# Patient Record
Sex: Female | Born: 1985
Health system: Southern US, Community
[De-identification: ages and names within clinical notes are randomized; demographics above are authoritative.]

## PROBLEM LIST (undated history)

## (undated) DIAGNOSIS — F32A Depression, unspecified: Secondary | ICD-10-CM

## (undated) DIAGNOSIS — F419 Anxiety disorder, unspecified: Secondary | ICD-10-CM

## (undated) DIAGNOSIS — R519 Headache, unspecified: Secondary | ICD-10-CM

## (undated) HISTORY — PX: ADENOIDECTOMY: SUR15

## (undated) HISTORY — PX: TYMPANOSTOMY TUBE PLACEMENT: SHX32

## (undated) HISTORY — PX: SHOULDER ARTHROSCOPY: SHX128

---

## 2017-11-13 DIAGNOSIS — Z13220 Encounter for screening for lipoid disorders: Secondary | ICD-10-CM | POA: Diagnosis not present

## 2017-11-13 DIAGNOSIS — Z0000 Encounter for general adult medical examination without abnormal findings: Secondary | ICD-10-CM | POA: Diagnosis not present

## 2017-11-13 DIAGNOSIS — Z23 Encounter for immunization: Secondary | ICD-10-CM | POA: Diagnosis not present

## 2018-04-04 DIAGNOSIS — N939 Abnormal uterine and vaginal bleeding, unspecified: Secondary | ICD-10-CM | POA: Diagnosis not present

## 2019-06-04 ENCOUNTER — Ambulatory Visit: Payer: 59 | Admitting: Allergy

## 2019-06-04 ENCOUNTER — Other Ambulatory Visit: Payer: Self-pay

## 2019-06-04 ENCOUNTER — Encounter: Payer: Self-pay | Admitting: Allergy

## 2019-06-04 ENCOUNTER — Telehealth: Payer: Self-pay

## 2019-06-04 VITALS — BP 120/72 | HR 79 | Temp 97.8°F | Resp 18 | Ht 70.0 in | Wt 248.5 lb

## 2019-06-04 DIAGNOSIS — J3089 Other allergic rhinitis: Secondary | ICD-10-CM | POA: Diagnosis not present

## 2019-06-04 DIAGNOSIS — J329 Chronic sinusitis, unspecified: Secondary | ICD-10-CM | POA: Insufficient documentation

## 2019-06-04 MED ORDER — XHANCE 93 MCG/ACT NA EXHU: 2.0000 | INHALANT_SUSPENSION | Freq: Two times a day (BID) | NASAL | 5 refills | Status: DC | PRN

## 2019-06-04 NOTE — Telephone Encounter (Signed)
Dr. Selena Batten would like patient referred to ENT Dr.Newman's office for chronic sinusitis.

## 2019-06-04 NOTE — Assessment & Plan Note (Addendum)
Perennial sinus pressure, headaches, PND for many years but worse the last 1.5 year since moved to [facility]. Patient had sinus surgery in 2007 with some benefit. Tried Singulair, Flonase, OTC antihistamines with no benefit. Sudafed seems to be the only thing that works.   Today's skin testing showed: Positive to weed. Borderline positive to cockroach, ragweed and mold mix #2 - perennial. Results given.   Discussed with patient that I'm not sure how much of the above allergens are causing her current sinus issues.   Start environmental control measures.  Start Xhance 2 sprays twice a day. Samples given. Demonstrated proper use.  Stop using over the counter sudafed.  Nasal saline spray (i.e., Simply Saline) or nasal saline lavage (i.e., NeilMed) is recommended as needed and prior to medicated nasal sprays.  Stop Singulair as not effective.  Refer to ENT for chronic sinusitis and history of sinus surgery - rule out anatomical etiology.

## 2019-06-04 NOTE — Patient Instructions (Addendum)
Today's skin testing showed: Positive to weed. Borderline positive to cockroach, ragweed and mold mix #2 - perennial. Results given.   Not sure how much of the above allergens are causing your current sinus issues.    Start environmental control measures.  Start Xhance 2 sprays twice a day. Samples given. Demonstrated proper use.  Stop using over the counter sudafed.  Nasal saline spray (i.e., Simply Saline) or nasal saline lavage (i.e., NeilMed) is recommended as needed and prior to medicated nasal sprays.  Stop Singulair as not effective.  Refer to ENT for chronic sinusitis.   Follow up in 2 months or sooner if needed.   Reducing Pollen Exposure . Pollen seasons: trees (spring), grass (summer) and ragweed/weeds (fall). Marland Kitchen Keep windows closed in your home and car to lower pollen exposure.  Susa Simmonds air conditioning in the bedroom and throughout the house if possible.  . Avoid going out in dry windy days - especially early morning. . Pollen counts are highest between 5 - 10 AM and on dry, hot and windy days.  . Save outside activities for late afternoon or after a heavy rain, when pollen levels are lower.  . Avoid mowing of grass if you have grass pollen allergy. Marland Kitchen Be aware that pollen can also be transported indoors on people and pets.  . Dry your clothes in an automatic dryer rather than hanging them outside where they might collect pollen.  . Rinse hair and eyes before bedtime.  Cockroach Allergen Avoidance Cockroaches are often found in the homes of densely populated urban areas, schools or commercial buildings, but these creatures can lurk almost anywhere. This does not mean that you have a dirty house or living area. . Block all areas where roaches can enter the home. This includes crevices, wall cracks and windows.  . Cockroaches need water to survive, so fix and seal all leaky faucets and pipes. Have an exterminator go through the house when your family and pets are gone  to eliminate any remaining roaches. Marland Kitchen Keep food in lidded containers and put pet food dishes away after your pets are done eating. Vacuum and sweep the floor after meals, and take out garbage and recyclables. Use lidded garbage containers in the kitchen. Wash dishes immediately after use and clean under stoves, refrigerators or toasters where crumbs can accumulate. Wipe off the stove and other kitchen surfaces and cupboards regularly.  Mold Control . Mold and fungi can grow on a variety of surfaces provided certain temperature and moisture conditions exist.  . Outdoor molds grow on plants, decaying vegetation and soil. The major outdoor mold, Alternaria and Cladosporium, are found in very high numbers during hot and dry conditions. Generally, a late summer - fall peak is seen for common outdoor fungal spores. Rain will temporarily lower outdoor mold spore count, but counts rise rapidly when the rainy period ends. . The most important indoor molds are Aspergillus and Penicillium. Dark, humid and poorly ventilated basements are ideal sites for mold growth. The next most common sites of mold growth are the bathroom and the kitchen. Outdoor (Seasonal) Mold Control . Use air conditioning and keep windows closed. . Avoid exposure to decaying vegetation. Marland Kitchen Avoid leaf raking. . Avoid grain handling. . Consider wearing a face mask if working in moldy areas.  Indoor (Perennial) Mold Control  . Maintain humidity below 50%. . Get rid of mold growth on hard surfaces with water, detergent and, if necessary, 5% bleach (do not mix with other cleaners). Then  dry the area completely. If mold covers an area more than 10 square feet, consider hiring an indoor environmental professional. . For clothing, washing with soap and water is best. If moldy items cannot be cleaned and dried, throw them away. . Remove sources e.g. contaminated carpets. . Repair and seal leaking roofs or pipes. Using dehumidifiers in damp  basements may be helpful, but empty the water and clean units regularly to prevent mildew from forming. All rooms, especially basements, bathrooms and kitchens, require ventilation and cleaning to deter mold and mildew growth. Avoid carpeting on concrete or damp floors, and storing items in damp areas.  Buffered Isotonic Saline Irrigations:  Goal: . When you irrigate with the isotonic saline (salt water) it washes mucous and other debris from your nose that could be contributing to your nasal symptoms.   Recipe: Marland Kitchen Obtain 1 quart jar that is clean . Fill with clean (bottled, boiled or distilled) water . Add 1-2 heaping teaspoons of salt without iodine o If the solution with 2 teaspoons of salt is too strong, adjust the amount down until better tolerated . Add 1 teaspoon of [employer] baking soda (pure bicarbonate) . Mix ingredients together and store at room temperature and discard after 1 week * Alternatively you can buy pre made salt packets for the NeilMed bottle or there          are other over the counter brands available  Instructions: . Warm  cup of the solution in the microwave if desired but be careful not to overheat as this will burn the inside of your nose . Stand over a sink (or do it while you shower) and squirt the solution into one side of your nose aiming towards the back of your head o Sometimes saying "coca cola" while irrigating can be helpful to prevent fluid from going down your throat  . The solution will travel to the back of your nose and then come out the other side . Perform this again on the other side . Try to do this twice a day . If you are using a nasal spray in addition to the irrigation, irrigate first and then use the topical nasal spray otherwise you will wash the nasal spray out of your nose

## 2019-06-04 NOTE — Progress Notes (Signed)
New Patient Note  RE: Bonnie Baker MRN: [id number] DOB: Sep 19, 1985 Date of Office Visit: 06/04/2019  Referring provider: Soundra Pilon, FNP Primary care provider: Soundra Pilon, FNP  Chief Complaint: Allergic Rhinitis   History of Present Illness: I had the pleasure of seeing Bonnie Baker for initial evaluation at the [facility] on 06/04/2019. She is a 34 y.o. female, who is referred here by Soundra Pilon, FNP for the evaluation of environmental allergies.  Allergic rhinitis: She reports symptoms of sinus pressure, PND, headaches/migraines. Symptoms have been going on for 1.5 years. The symptoms are present all year around with worsening in winter. Other triggers include exposure to none. Anosmia: no. Headache: yes. She has used Flonase, Singulair, OTC antihistamines, sudafed with minimal improvement in symptoms. Sinus infections: 0. Previous work up includes: skin testing in 2006 was positive to trees, weeds and grasses per patient report. Previous ENT evaluation: yes in [place] and had sinus surgery in 2007 with some benefit. Previous sinus imaging: not recently. History of nasal polyps: no. Last eye exam: 6 months ago. History of reflux: denies.  Assessment and Plan: Bonnie Baker is a 34 y.o. female with: Chronic sinusitis Perennial sinus pressure, headaches, PND for many years but worse the last 1.5 year since moved to [facility]. Patient had sinus surgery in 2007 with some benefit. Tried Singulair, Flonase, OTC antihistamines with no benefit. Sudafed seems to be the only thing that works.   Today's skin testing showed: Positive to weed. Borderline positive to cockroach, ragweed and mold mix #2 - perennial. Results given.   Discussed with patient that I'm not sure how much of the above allergens are causing her current sinus issues.   Start environmental control measures.  Start Xhance 2 sprays twice a day. Samples given. Demonstrated proper use.  Stop using  over the counter sudafed.  Nasal saline spray (i.e., Simply Saline) or nasal saline lavage (i.e., NeilMed) is recommended as needed and prior to medicated nasal sprays.  Stop Singulair as not effective.  Refer to ENT for chronic sinusitis and history of sinus surgery - rule out anatomical etiology.   Return in about 2 months (around 08/04/2019).  Meds ordered this encounter  Medications  . Fluticasone Propionate (XHANCE) 93 MCG/ACT EXHU    Sig: Place 2 sprays into the nose 2 (two) times daily as needed.    Dispense:  32 mL    Refill:  5    [id number] (M)   Other allergy screening: Asthma: no Rhino conjunctivitis: yes Food allergy: no Medication allergy: no Hymenoptera allergy: no Urticaria: no Eczema:no History of recurrent infections suggestive of immunodeficency: no  Diagnostics: Skin Testing: Environmental allergy panel. Positive to weed. Borderline positive to cockroach, ragweed and mold mix #2 - perennial. Results discussed with patient/family. Airborne Adult Perc - 06/04/19 0943    Time Antigen Placed  2094    Allergen Manufacturer  Waynette Buttery    Location  Back    Number of Test  59    Panel 1  Select    1. Control-Buffer 50% Glycerol  Negative    2. Control-Histamine 1 mg/ml  2+    3. Albumin saline  Negative    4. Bahia  Negative    5. French Southern Territ[place]  Negative    6. Johnson  Negative    7. Kentucky Blue  Negative    8. Meadow Fescue  Negative    9. Perennial Rye  Negative    10. Sweet Vernal  Negative  11. Timothy  Negative    12. Cocklebur  Negative    13. Burweed Marshelder  Negative    14. Ragweed, short  Negative    15. Ragweed, Giant  Negative    16. Plantain,  English  Negative    17. Lamb's Quarters  Negative    18. Sheep Sorrell  Negative    19. Rough Pigweed  Negative    20. Marsh Elder, Rough  Negative    21. Mugwort, Common  Negative    22. Ash mix  Negative    23. Birch mix  Negative    24. Beech American  Negative    25. Box, Elder  Negative     26. Cedar, red  Negative    27. Cottonwood, Guinea-Bissau  Negative    28. Elm mix  Negative    29. Hickory mix  Negative    30. Maple mix  Negative    31. Oak, Guinea-Bissau mix  Negative    32. Pecan Pollen  Negative    33. Pine mix  Negative    34. Sycamore Eastern  Negative    35. Walnut, Black Pollen  Negative    36. Alternaria alternata  Negative    37. Cladosporium Herbarum  Negative    38. Aspergillus mix  Negative    39. Penicillium mix  Negative    40. Bipolaris sorokiniana (Helminthosporium)  Negative    41. Drechslera spicifera (Curvularia)  Negative    42. Mucor plumbeus  Negative    43. Fusarium moniliforme  Negative    44. Aureobasidium pullulans (pullulara)  Negative    45. Rhizopus oryzae  Negative    46. Botrytis cinera  Negative    47. Epicoccum nigrum  Negative    48. Phoma betae  Negative    49. Candida Albicans  Negative    50. Trichophyton mentagrophytes  Negative    51. Mite, D Farinae  5,000 AU/ml  Negative    52. Mite, D Pteronyssinus  5,000 AU/ml  Negative    53. Cat Hair 10,000 BAU/ml  Negative    54.  Dog Epithelia  Negative    55. Mixed Feathers  Negative    56. Horse Epithelia  Negative    57. Cockroach, [place]  --   +/-   58. Mouse  Negative    59. Tobacco Leaf  Negative     Intradermal - 06/04/19 1032    Time Antigen Placed  1032    Allergen Manufacturer  Greer    Location  Back    Number of Test  14    Intradermal  Select    Control  Negative    French Southern Territ[place]  Negative    Johnson  Negative    7 Grass  Negative    Ragweed mix  --   +/-   Weed mix  2+    Tree mix  Negative    Mold 1  Negative    Mold 2  --   +/-   Mold 3  Negative    Mold 4  Negative    Cat  Negative    Dog  Negative    Mite mix  Negative       Past Medical History: Patient Active Problem List   Diagnosis Date Noted  . Chronic sinusitis 06/04/2019  . Other allergic rhinitis 06/04/2019   History reviewed. No pertinent past medical history. Past Surgical  History: Past Surgical History:  Procedure Laterality Date  . ADENOIDECTOMY    .  TYMPANOSTOMY TUBE PLACEMENT     Medication List:  Current Outpatient Medications  Medication Sig Dispense Refill  . escitalopram (LEXAPRO) 20 MG tablet Take 20 mg by mouth daily.    Marland Kitchen ibuprofen (ADVIL) 200 MG tablet Take 600-800 mg by mouth every 6 (six) hours as needed.    . Prenatal 27-1 MG TABS Take 1 tablet by mouth daily.    . pseudoephedrine (SUDAFED) 60 MG tablet Take 60 mg by mouth every 4 (four) hours as needed for congestion.    . Fluticasone Propionate (XHANCE) 93 MCG/ACT EXHU Place 2 sprays into the nose 2 (two) times daily as needed. 32 mL 5   No current facility-administered medications for this visit.   Allergies: No Known Allergies Social History: Social History   Socioeconomic History  . Marital status: Married    Spouse name: Not on file  . Number of children: Not on file  . Years of education: Not on file  . Highest education level: Not on file  Occupational History  . Not on file  Tobacco Use  . Smoking status: Never Smoker  . Smokeless tobacco: Never Used  Substance and Sexual Activity  . Alcohol use: Yes  . Drug use: Never  . Sexual activity: Not on file  Other Topics Concern  . Not on file  Social History Narrative  . Not on file   Social Determinants of Health   Financial Resource Strain:   . Difficulty of Paying Living Expenses:   Food Insecurity:   . Worried About Programme researcher, broadcasting/film/video in the Last Year:   . Barista in the Last Year:   Transportation Needs:   . Freight forwarder (Medical):   Marland Kitchen Lack of Transportation (Non-Medical):   Physical Activity:   . Days of Exercise per Week:   . Minutes of Exercise per Session:   Stress:   . Feeling of Stress :   Social Connections:   . Frequency of Communication with Friends and Family:   . Frequency of Social Gatherings with Friends and Family:   . Attends Religious Services:   . Active Member of  Clubs or Organizations:   . Attends Banker Meetings:   Marland Kitchen Marital Status:    Lives in a 34 year old home. Smoking: denies Occupation: Adult nurse HistorySurveyor, minerals in the house: no Engineer, civil (consulting) in the family room: yes Carpet in the bedroom: yes Heating: electric Cooling: central Pet: yes a dog x 11 years  Family History: Family History  Problem Relation Age of Onset  . Allergic rhinitis Mother   . Allergic rhinitis Brother   . Allergic rhinitis Brother    Review of Systems  Constitutional: Negative for appetite change, chills, fever and unexpected weight change.  HENT: Positive for congestion, postnasal drip, sinus pressure and voice change. Negative for rhinorrhea.   Eyes: Negative for itching.  Respiratory: Negative for cough, chest tightness, shortness of breath and wheezing.   Cardiovascular: Negative for chest pain.  Gastrointestinal: Negative for abdominal pain.  Genitourinary: Negative for difficulty urinating.  Skin: Negative for rash.  Allergic/Immunologic: Positive for environmental allergies. Negative for food allergies.  Neurological: Positive for headaches.   Objective: BP 120/72 (BP Location: Left Arm, Patient Position: Sitting, Cuff Size: Normal)   Pulse 79   Temp 97.8 F (36.6 C) (Temporal)   Resp 18   Ht 5\' 10"  (1.778 m)   Wt 248 lb 8 oz (112.7 kg)   SpO2 96%  BMI 35.66 kg/m  Body mass index is 35.66 kg/m. Physical Exam  Constitutional: She is oriented to person, place, and time. She appears well-developed and well-nourished.  HENT:  Head: Normocephalic and atraumatic.  Right Ear: External ear normal.  Left Ear: External ear normal.  Mouth/Throat: Oropharynx is clear and moist.  Right side nasal congestion  Eyes: Conjunctivae and EOM are normal.  Cardiovascular: Normal rate, regular rhythm and normal heart sounds. Exam reveals no gallop and no friction rub.  No murmur heard. Pulmonary/Chest: Effort normal and  breath sounds normal. She has no wheezes. She has no rales.  Abdominal: Soft.  Musculoskeletal:     Cervical back: Neck supple.  Neurological: She is alert and oriented to person, place, and time.  Skin: Skin is warm. No rash noted.  Psychiatric: She has a normal mood and affect. Her behavior is normal.  Nursing note and vitals reviewed.  The plan was reviewed with the patient/family, and all questions/concerned were addressed.  It was my pleasure to see Bonnie Baker today and participate in her care. Please feel free to contact me with any questions or concerns.  Sincerely,  Rexene Alberts, DO Allergy & Immunology  [facility] office: [phone] [facility] office: [place] office: [phone]

## 2019-06-05 NOTE — Telephone Encounter (Signed)
Patients referral has been placed to Dr Ezzard Standing for scheduling.   Thanks

## 2019-06-23 ENCOUNTER — Encounter (INDEPENDENT_AMBULATORY_CARE_PROVIDER_SITE_OTHER): Payer: Self-pay | Admitting: Otolaryngology

## 2019-06-23 ENCOUNTER — Other Ambulatory Visit: Payer: Self-pay

## 2019-06-23 ENCOUNTER — Ambulatory Visit (INDEPENDENT_AMBULATORY_CARE_PROVIDER_SITE_OTHER): Payer: 59 | Admitting: Otolaryngology

## 2019-06-23 VITALS — Temp 97.9°F

## 2019-06-23 DIAGNOSIS — R519 Headache, unspecified: Secondary | ICD-10-CM | POA: Diagnosis not present

## 2019-06-23 DIAGNOSIS — J310 Chronic rhinitis: Secondary | ICD-10-CM | POA: Diagnosis not present

## 2019-06-23 DIAGNOSIS — Z8669 Personal history of other diseases of the nervous system and sense organs: Secondary | ICD-10-CM | POA: Diagnosis not present

## 2019-06-23 NOTE — Progress Notes (Signed)
HPI: Bonnie Baker is a 34 y.o. female who presents is referred by Dr. Maudie Mercury for evaluation of sinuses.  Her main complaint seems to be headaches that she relates to her sinuses.  She has seen an allergist with negative allergy work-up.  She is presently on XHANCE nasal spray which he uses regularly.  She denies any trouble breathing through her nose.  She feels pressure in her cheek area on both sides.  She states that she has 2 or 3 migraines a week. She states that she had sinus surgery performed when she was 18. She just recently moved to [place] area a couple years ago and that her symptoms have been worse since moving here.  She feels like some of it is related to allergies.. She denies any yellow-green discharge from her nose but does feel pressure in the cheek areas today.  No past medical history on file. Past Surgical History:  Procedure Laterality Date  . ADENOIDECTOMY    . TYMPANOSTOMY TUBE PLACEMENT     Social History   Socioeconomic History  . Marital status: Married    Spouse name: Not on file  . Number of children: Not on file  . Years of education: Not on file  . Highest education level: Not on file  Occupational History  . Not on file  Tobacco Use  . Smoking status: Never Smoker  . Smokeless tobacco: Never Used  Substance and Sexual Activity  . Alcohol use: Yes  . Drug use: Never  . Sexual activity: Not on file  Other Topics Concern  . Not on file  Social History Narrative  . Not on file   Social Determinants of Health   Financial Resource Strain:   . Difficulty of Paying Living Expenses:   Food Insecurity:   . Worried About Charity fundraiser in the Last Year:   . Arboriculturist in the Last Year:   Transportation Needs:   . Film/video editor (Medical):   Marland Kitchen Lack of Transportation (Non-Medical):   Physical Activity:   . Days of Exercise per Week:   . Minutes of Exercise per Session:   Stress:   . Feeling of Stress :   Social Connections:    . Frequency of Communication with Friends and Family:   . Frequency of Social Gatherings with Friends and Family:   . Attends Religious Services:   . Active Member of Clubs or Organizations:   . Attends Archivist Meetings:   Marland Kitchen Marital Status:    Family History  Problem Relation Age of Onset  . Allergic rhinitis Mother   . Allergic rhinitis Brother   . Allergic rhinitis Brother    No Known Allergies Prior to Admission medications   Medication Sig Start Date End Date Taking? Authorizing Provider  escitalopram (LEXAPRO) 20 MG tablet Take 20 mg by mouth daily. 05/24/19  Yes [provider]  Fluticasone Propionate (XHANCE) 93 MCG/ACT EXHU Place 2 sprays into the nose 2 (two) times daily as needed. 06/04/19  Yes Garnet Sierras, DO  ibuprofen (ADVIL) 200 MG tablet Take 600-800 mg by mouth every 6 (six) hours as needed.   Yes [provider]  Prenatal 27-1 MG TABS Take 1 tablet by mouth daily. 05/24/19  Yes [provider]  pseudoephedrine (SUDAFED) 60 MG tablet Take 60 mg by mouth every 4 (four) hours as needed for congestion.   Yes [provider]     Positive ROS: Otherwise negative  All other  systems have been reviewed and were otherwise negative with the exception of those mentioned in the HPI and as above.  Physical Exam: Constitutional: Alert, well-appearing, no acute distress Ears: External ears without lesions or tenderness. Ear canals are clear bilaterally with intact, clear TMs bilaterally. Nasal: External nose without lesions. Septum mildly deviated to the left.. Clear nasal passages bilaterally. Nasal endoscopy was performed in the office today.  On nasal endoscopy both middle meatus regions were widely patent.  The maxillary ostia could be identified easily on both sides and was widely patent.  The anterior ethmoid areas were clear bilaterally with no obvious mucopurulent discharge noted.  No obvious purulent discharge noted from the  posterior ethmoid or sphenoid region. Oral: Lips and gums without lesions. Tongue and palate mucosa without lesions. Posterior oropharynx clear. Neck: No palpable adenopathy or masses Respiratory: Breathing comfortably  Skin: No facial/neck lesions or rash noted.  Nasal/sinus endoscopy  Date/Time: 06/23/2019 5:38 PM Performed by: Drema Halon, MD Authorized by: Drema Halon, MD   Consent:    Consent obtained:  Verbal   Consent given by:  Patient Procedure details:    Indications: sino-nasal symptoms     Medication:  Afrin   Instrument: flexible fiberoptic nasal endoscope     Scope location: bilateral nare   Septum:    Deviation: deviated to the left     Severity of deviation: mild   Sinus:    Right middle meatus: normal     Left middle meatus: normal     Right nasopharynx: normal     Left nasopharynx: normal   Comments:     On nasal endoscopy the middle meatus regions were clear bilaterally in both maxillary ostia were patent bilaterally with no drainage noted.    Assessment: History of headaches. History of previous sinus surgery with clear sinuses on nasal endoscopy in the office today.  Plan: Discussed with the patient today concerning obtaining a CT scan for complete evaluation of the sinuses to rule out any residual disease.  But discussed with her concerning normal examination and no need for antibiotics at this time.  Would recommend continue with nasal steroid spray and saline rinses as needed. She will call us following the results of the CT scan.   Narda Bonds, MD   CC:

## 2019-06-26 ENCOUNTER — Other Ambulatory Visit (INDEPENDENT_AMBULATORY_CARE_PROVIDER_SITE_OTHER): Payer: Self-pay

## 2019-06-26 DIAGNOSIS — J329 Chronic sinusitis, unspecified: Secondary | ICD-10-CM

## 2019-06-26 NOTE — Progress Notes (Signed)
Ct max wo

## 2019-06-30 ENCOUNTER — Telehealth: Payer: Self-pay

## 2019-06-30 NOTE — Telephone Encounter (Signed)
Prior authorization was denied because Bonnie Baker is excluded from coverage. Patient can still do the 50.00 co-pay if they would like to do so. I have left a message with the patient to call back to discuss this.

## 2019-06-30 NOTE — Telephone Encounter (Signed)
Prior authorization for Xhance submitted on covermymeds.  

## 2019-07-03 NOTE — Telephone Encounter (Signed)
I left a detailed message explaining co-pay details and options to patient. I let her know that if she does not wish to pay that co-pay to give [place] a call back.

## 2019-07-09 ENCOUNTER — Other Ambulatory Visit: Payer: Self-pay

## 2019-07-09 ENCOUNTER — Ambulatory Visit
Admission: RE | Admit: 2019-07-09 | Discharge: 2019-07-09 | Disposition: A | Discharge status: Home / Self Care | Payer: 59 | Source: Ambulatory Visit | Attending: Otolaryngology | Admitting: Otolaryngology

## 2019-08-12 ENCOUNTER — Ambulatory Visit: Payer: 59 | Admitting: Allergy

## 2019-08-12 NOTE — Progress Notes (Deleted)
Follow Up Note  RE: Bonnie Baker MRN: [id number] DOB: Aug 17, 1985 Date of Office Visit: 08/12/2019  Referring provider: Soundra Pilon, FNP Primary care provider: Soundra Pilon, FNP  Chief Complaint: No chief complaint on file.  History of Present Illness: I had the pleasure of seeing [name] for a follow up visit at the [facility] on 08/12/2019. She is a 34 y.o. female, who is being followed for chronic sinusitis. Her previous allergy office visit was on 06/04/2019 with Dr. Selena Batten. Today is a regular follow up visit.  Chronic sinusitis Perennial sinus pressure, headaches, PND for many years but worse the last 1.5 year since moved to [facility]. Patient had sinus surgery in 2007 with some benefit. Tried Singulair, Flonase, OTC antihistamines with no benefit. Sudafed seems to be the only thing that works.   Today's skin testing showed: Positive to weed. Borderline positive to cockroach, ragweed and mold mix #2 - perennial. Results given.   Discussed with patient that I'm not sure how much of the above allergens are causing her current sinus issues.   Start environmental control measures.  Start Xhance 2 sprays twice a day. Samples given. Demonstrated proper use.  Stop using over the counter sudafed.  Nasal saline spray (i.e., Simply Saline) or nasal saline lavage (i.e., NeilMed) is recommended as needed and prior to medicated nasal sprays.  Stop Singulair as not effective.  Refer to ENT for chronic sinusitis and history of sinus surgery - rule out anatomical etiology.   Return in about 2 months (around 08/04/2019).   Assessment and Plan: [name] is a 34 y.o. female with: No problem-specific Assessment & Plan notes found for this encounter.  No follow-ups on file.  No orders of the defined types were placed in this encounter.  Lab Orders  No laboratory test(s) ordered today    Diagnostics: Spirometry:  Tracings reviewed. Her effort: {Blank  single:19197::"Good reproducible efforts.","It was hard to get consistent efforts and there is a question as to whether this reflects a maximal maneuver.","Poor effort, data can not be interpreted."} FVC: ***L FEV1: ***L, ***% predicted FEV1/FVC ratio: ***% Interpretation: {Blank single:19197::"Spirometry consistent with mild obstructive disease","Spirometry consistent with moderate obstructive disease","Spirometry consistent with severe obstructive disease","Spirometry consistent with possible restrictive disease","Spirometry consistent with mixed obstructive and restrictive disease","Spirometry uninterpretable due to technique","Spirometry consistent with normal pattern","No overt abnormalities noted given today's efforts"}.  Please see scanned spirometry results for details.  Skin Testing: {Blank single:19197::"Select foods","Environmental allergy panel","Environmental allergy panel and select foods","Food allergy panel","None","Deferred due to recent antihistamines use"}. Positive test to: ***. Negative test to: ***.  Results discussed with patient/family.   Medication List:  Current Outpatient Medications  Medication Sig Dispense Refill  . escitalopram (LEXAPRO) 20 MG tablet Take 20 mg by mouth daily.    . Fluticasone Propionate (XHANCE) 93 MCG/ACT EXHU Place 2 sprays into the nose 2 (two) times daily as needed. 32 mL 5  . ibuprofen (ADVIL) 200 MG tablet Take 600-800 mg by mouth every 6 (six) hours as needed.    . Prenatal 27-1 MG TABS Take 1 tablet by mouth daily.    . pseudoephedrine (SUDAFED) 60 MG tablet Take 60 mg by mouth every 4 (four) hours as needed for congestion.     No current facility-administered medications for this visit.   Allergies: No Known Allergies I reviewed her past medical history, social history, family history, and environmental history and no significant changes have been reported from her previous visit.  Review of  Systems  Constitutional: Negative for  appetite change, chills, fever and unexpected weight change.  HENT: Positive for congestion, postnasal drip, sinus pressure and voice change. Negative for rhinorrhea.   Eyes: Negative for itching.  Respiratory: Negative for cough, chest tightness, shortness of breath and wheezing.   Cardiovascular: Negative for chest pain.  Gastrointestinal: Negative for abdominal pain.  Genitourinary: Negative for difficulty urinating.  Skin: Negative for rash.  Allergic/Immunologic: Positive for environmental allergies. Negative for food allergies.  Neurological: Positive for headaches.   Objective: There were no vitals taken for this visit. There is no height or weight on file to calculate BMI. Physical Exam Vitals and nursing note reviewed.  Constitutional:      Appearance: She is well-developed.  HENT:     Head: Normocephalic and atraumatic.     Right Ear: External ear normal.     Left Ear: External ear normal.  Eyes:     Conjunctiva/sclera: Conjunctivae normal.  Cardiovascular:     Rate and Rhythm: Normal rate and regular rhythm.     Heart sounds: Normal heart sounds. No murmur heard.  No friction rub. No gallop.   Pulmonary:     Effort: Pulmonary effort is normal.     Breath sounds: Normal breath sounds. No wheezing or rales.  Abdominal:     Palpations: Abdomen is soft.  Musculoskeletal:     Cervical back: Neck supple.  Skin:    General: Skin is warm.     Findings: No rash.  Neurological:     Mental Status: She is alert and oriented to person, place, and time.  Psychiatric:        Behavior: Behavior normal.    Previous notes and tests were reviewed. The plan was reviewed with the patient/family, and all questions/concerned were addressed.  It was my pleasure to see Bonnie Baker today and participate in her care. Please feel free to contact me with any questions or concerns.  Sincerely,  Wyline Mood, DO Allergy & Immunology  [facility]  office: [phone] [facility] office: [phone] [place] office: [phone]

## 2019-09-08 ENCOUNTER — Emergency Department (HOSPITAL_COMMUNITY)
Admission: EM | Admit: 2019-09-08 | Discharge: 2019-09-08 | Disposition: A | Discharge status: Home / Self Care | Payer: 59 | Attending: Emergency Medicine | Admitting: Emergency Medicine

## 2019-09-08 ENCOUNTER — Other Ambulatory Visit: Payer: Self-pay

## 2019-09-08 ENCOUNTER — Encounter (HOSPITAL_COMMUNITY): Payer: Self-pay | Admitting: Emergency Medicine

## 2019-09-08 ENCOUNTER — Emergency Department (HOSPITAL_COMMUNITY): Payer: 59

## 2019-09-08 DIAGNOSIS — S161XXA Strain of muscle, fascia and tendon at neck level, initial encounter: Secondary | ICD-10-CM | POA: Insufficient documentation

## 2019-09-08 DIAGNOSIS — Y9289 Other specified places as the place of occurrence of the external cause: Secondary | ICD-10-CM | POA: Insufficient documentation

## 2019-09-08 DIAGNOSIS — Y9389 Activity, other specified: Secondary | ICD-10-CM | POA: Diagnosis not present

## 2019-09-08 DIAGNOSIS — M25511 Pain in right shoulder: Secondary | ICD-10-CM | POA: Diagnosis not present

## 2019-09-08 DIAGNOSIS — Y999 Unspecified external cause status: Secondary | ICD-10-CM | POA: Diagnosis not present

## 2019-09-08 DIAGNOSIS — S199XXA Unspecified injury of neck, initial encounter: Secondary | ICD-10-CM | POA: Diagnosis present

## 2019-09-08 MED ORDER — CELECOXIB 200 MG PO CAPS
200.0000 mg | ORAL_CAPSULE | Freq: Two times a day (BID) | ORAL | 0 refills | Status: DC
Start: 2019-09-08 — End: 2021-04-13

## 2019-09-08 MED ORDER — CYCLOBENZAPRINE HCL 10 MG PO TABS: 5.0000 mg | ORAL_TABLET | Freq: Two times a day (BID) | ORAL | 0 refills | Status: DC | PRN

## 2019-09-08 MED ORDER — IBUPROFEN 800 MG PO TABS
800.0000 mg | ORAL_TABLET | Freq: Once | ORAL | Status: AC
  Administered 2019-09-08: 800 mg via ORAL
  Filled 2019-09-08: qty 1
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
## 2019-09-08 NOTE — Discharge Instructions (Addendum)
Your images were negative for acute abnormality.  Apply ice to sore areas for 30 minutes at a time, 4 times a day.  Take ibuprofen, naproxen, or acetaminophen as needed for pain.  Return to the emergency department immediately if you develop any of the following symptoms: You have numbness, tingling, or weakness in the arms or legs. You develop severe headaches not relieved with medicine. You have severe neck pain, especially tenderness in the middle of the back of your neck. You have changes in bowel or bladder control. There is increasing pain in any area of the body. You have shortness of breath, light-headedness, dizziness, or fainting. You have chest pain. You feel sick to your stomach (nauseous), throw up (vomit), or sweat. You have increasing abdominal discomfort. There is blood in your urine, stool, or vomit. You have pain in your shoulder (shoulder strap areas). You feel your symptoms are getting worse.

## 2020-11-02 LAB — OB RESULTS CONSOLE HIV ANTIBODY (ROUTINE TESTING): HIV: NONREACTIVE

## 2020-11-02 LAB — OB RESULTS CONSOLE HEPATITIS B SURFACE ANTIGEN: Hepatitis B Surface Ag: NEGATIVE

## 2020-11-02 LAB — OB RESULTS CONSOLE RUBELLA ANTIBODY, IGM: Rubella: NON-IMMUNE/NOT IMMUNE

## 2020-11-02 LAB — OB RESULTS CONSOLE RPR: RPR: NONREACTIVE

## 2021-04-10 ENCOUNTER — Encounter (HOSPITAL_COMMUNITY)
Admission: AD | Disposition: A | Discharge status: Home / Self Care | Payer: Self-pay | Source: Home / Self Care | Attending: Obstetrics and Gynecology

## 2021-04-10 ENCOUNTER — Inpatient Hospital Stay (HOSPITAL_BASED_OUTPATIENT_CLINIC_OR_DEPARTMENT_OTHER): Payer: 59

## 2021-04-10 ENCOUNTER — Other Ambulatory Visit: Payer: Self-pay

## 2021-04-10 ENCOUNTER — Inpatient Hospital Stay (HOSPITAL_COMMUNITY): Payer: 59 | Admitting: Certified Registered Nurse Anesthetist

## 2021-04-10 ENCOUNTER — Inpatient Hospital Stay (HOSPITAL_COMMUNITY)
Admission: AD | Admit: 2021-04-10 | Discharge: 2021-04-13 | DRG: 786 | Disposition: A | Discharge status: Home / Self Care | Payer: 59 | Attending: Obstetrics and Gynecology | Admitting: Obstetrics and Gynecology

## 2021-04-10 ENCOUNTER — Other Ambulatory Visit: Payer: Self-pay | Admitting: Obstetrics and Gynecology

## 2021-04-10 ENCOUNTER — Encounter (HOSPITAL_COMMUNITY): Payer: Self-pay | Admitting: Obstetrics and Gynecology

## 2021-04-10 DIAGNOSIS — Z20822 Contact with and (suspected) exposure to covid-19: Secondary | ICD-10-CM | POA: Diagnosis present

## 2021-04-10 DIAGNOSIS — F419 Anxiety disorder, unspecified: Secondary | ICD-10-CM | POA: Diagnosis present

## 2021-04-10 DIAGNOSIS — O368130 Decreased fetal movements, third trimester, not applicable or unspecified: Secondary | ICD-10-CM

## 2021-04-10 DIAGNOSIS — O99344 Other mental disorders complicating childbirth: Secondary | ICD-10-CM | POA: Diagnosis present

## 2021-04-10 DIAGNOSIS — F32A Depression, unspecified: Secondary | ICD-10-CM | POA: Diagnosis present

## 2021-04-10 DIAGNOSIS — Z8659 Personal history of other mental and behavioral disorders: Secondary | ICD-10-CM

## 2021-04-10 DIAGNOSIS — Z3A33 33 weeks gestation of pregnancy: Secondary | ICD-10-CM

## 2021-04-10 DIAGNOSIS — Z98891 History of uterine scar from previous surgery: Secondary | ICD-10-CM

## 2021-04-10 DIAGNOSIS — O34219 Maternal care for unspecified type scar from previous cesarean delivery: Secondary | ICD-10-CM | POA: Diagnosis not present

## 2021-04-10 DIAGNOSIS — O4593 Premature separation of placenta, unspecified, third trimester: Secondary | ICD-10-CM

## 2021-04-10 DIAGNOSIS — O43123 Velamentous insertion of umbilical cord, third trimester: Secondary | ICD-10-CM | POA: Diagnosis present

## 2021-04-10 DIAGNOSIS — O4700 False labor before 37 completed weeks of gestation, unspecified trimester: Secondary | ICD-10-CM | POA: Diagnosis not present

## 2021-04-10 DIAGNOSIS — O43193 Other malformation of placenta, third trimester: Secondary | ICD-10-CM

## 2021-04-10 DIAGNOSIS — O3660X0 Maternal care for excessive fetal growth, unspecified trimester, not applicable or unspecified: Secondary | ICD-10-CM

## 2021-04-10 DIAGNOSIS — O368190 Decreased fetal movements, unspecified trimester, not applicable or unspecified: Secondary | ICD-10-CM

## 2021-04-10 DIAGNOSIS — O4590 Premature separation of placenta, unspecified, unspecified trimester: Secondary | ICD-10-CM

## 2021-04-10 DIAGNOSIS — O26899 Other specified pregnancy related conditions, unspecified trimester: Secondary | ICD-10-CM

## 2021-04-10 DIAGNOSIS — O34211 Maternal care for low transverse scar from previous cesarean delivery: Secondary | ICD-10-CM | POA: Diagnosis present

## 2021-04-10 LAB — GC/CHLAMYDIA PROBE AMP (~~LOC~~) NOT AT ARMC
Chlamydia: NEGATIVE
Comment: NEGATIVE
Comment: NORMAL
Neisseria Gonorrhea: NEGATIVE

## 2021-04-10 LAB — COMPREHENSIVE METABOLIC PANEL
ALT: 11 U/L (ref 0–44)
AST: 16 U/L (ref 15–41)
Albumin: 2.7 g/dL — ABNORMAL LOW (ref 3.5–5.0)
Alkaline Phosphatase: 81 U/L (ref 38–126)
Anion gap: 9 (ref 5–15)
BUN: 6 mg/dL (ref 6–20)
CO2: 21 mmol/L — ABNORMAL LOW (ref 22–32)
Calcium: 8.9 mg/dL (ref 8.9–10.3)
Chloride: 105 mmol/L (ref 98–111)
Creatinine, Ser: 0.52 mg/dL (ref 0.44–1.00)
GFR, Estimated: >60 mL/min (ref >60)
Glucose, Bld: 106 mg/dL — ABNORMAL HIGH (ref 70–99)
Potassium: 3.6 mmol/L (ref 3.5–5.1)
Sodium: 135 mmol/L (ref 135–145)
Total Bilirubin: 0.6 mg/dL (ref 0.3–1.2)
Total Protein: 6.2 g/dL — ABNORMAL LOW (ref 6.5–8.1)

## 2021-04-10 LAB — URINALYSIS, ROUTINE W REFLEX MICROSCOPIC
Bilirubin Urine: NEGATIVE
Glucose, UA: NEGATIVE mg/dL
Hgb urine dipstick: NEGATIVE
Ketones, ur: NEGATIVE mg/dL
Leukocytes,Ua: NEGATIVE
Nitrite: NEGATIVE
Protein, ur: NEGATIVE mg/dL
Specific Gravity, Urine: 1.009 (ref 1.005–1.030)
pH: 6 (ref 5.0–8.0)

## 2021-04-10 LAB — PROTEIN / CREATININE RATIO, URINE
Creatinine, Urine: 58.43 mg/dL
Total Protein, Urine: <6 mg/dL

## 2021-04-10 LAB — WET PREP, GENITAL
Clue Cells Wet Prep HPF POC: NONE SEEN
Sperm: NONE SEEN
Trich, Wet Prep: NONE SEEN
WBC, Wet Prep HPF POC: >=10 — AB
Yeast Wet Prep HPF POC: NONE SEEN

## 2021-04-10 LAB — RESP PANEL BY RT-PCR (FLU A&B, COVID) ARPGX2
Influenza A by PCR: NEGATIVE
Influenza B by PCR: NEGATIVE
SARS Coronavirus 2 by RT PCR: NEGATIVE

## 2021-04-10 LAB — CBC
HCT: 35.5 % — ABNORMAL LOW (ref 36.0–46.0)
HCT: 31.1 % — ABNORMAL LOW (ref 36.0–46.0)
Hemoglobin: 12 g/dL (ref 12.0–15.0)
Hemoglobin: 10.7 g/dL — ABNORMAL LOW (ref 12.0–15.0)
MCH: 30.8 pg (ref 26.0–34.0)
MCH: 31.1 pg (ref 26.0–34.0)
MCHC: 33.8 g/dL (ref 30.0–36.0)
MCHC: 34.4 g/dL (ref 30.0–36.0)
MCV: 91.3 fL (ref 80.0–100.0)
MCV: 90.4 fL (ref 80.0–100.0)
Platelets: 235 10*3/uL (ref 150–400)
Platelets: 254 10*3/uL (ref 150–400)
RBC: 3.89 MIL/uL (ref 3.87–5.11)
RBC: 3.44 MIL/uL — ABNORMAL LOW (ref 3.87–5.11)
RDW: 13.2 % (ref 11.5–15.5)
RDW: 13.2 % (ref 11.5–15.5)
WBC: 15.9 10*3/uL — ABNORMAL HIGH (ref 4.0–10.5)
WBC: 23.4 10*3/uL — ABNORMAL HIGH (ref 4.0–10.5)
nRBC: 0 % (ref 0.0–0.2)
nRBC: 0 % (ref 0.0–0.2)

## 2021-04-10 LAB — FETAL FIBRONECTIN: Fetal Fibronectin: NEGATIVE

## 2021-04-10 LAB — PREPARE RBC (CROSSMATCH)

## 2021-04-10 LAB — HCG, QUANTITATIVE, PREGNANCY: hCG, Beta Chain, Quant, S: 37129 m[IU]/mL — ABNORMAL HIGH (ref <5)

## 2021-04-10 SURGERY — Surgical Case
Anesthesia: General

## 2021-04-10 MED ORDER — SIMETHICONE 80 MG PO CHEW
80.0000 mg | CHEWABLE_TABLET | Freq: Three times a day (TID) | ORAL | Status: DC
  Administered 2021-04-11 – 2021-04-13 (×7): 80 mg via ORAL
  Filled 2021-04-10 (×7): qty 1

## 2021-04-10 MED ORDER — COCONUT OIL OIL: 1.0000 "application " | TOPICAL_OIL | Status: DC | PRN

## 2021-04-10 MED ORDER — HYDROMORPHONE HCL 1 MG/ML IJ SOLN
INTRAMUSCULAR | Status: AC
  Filled 2021-04-10: qty 0.5

## 2021-04-10 MED ORDER — NIFEDIPINE 10 MG PO CAPS
10.0000 mg | ORAL_CAPSULE | ORAL | Status: DC | PRN
  Administered 2021-04-10 (×2): 10 mg via ORAL
  Filled 2021-04-10 (×2): qty 1

## 2021-04-10 MED ORDER — SOD CITRATE-CITRIC ACID 500-334 MG/5ML PO SOLN: 30.0000 mL | Freq: Once | ORAL | Status: AC

## 2021-04-10 MED ORDER — MENTHOL 3 MG MT LOZG: 1.0000 | LOZENGE | OROMUCOSAL | Status: DC | PRN

## 2021-04-10 MED ORDER — METHYLERGONOVINE MALEATE 0.2 MG PO TABS: 0.2000 mg | ORAL_TABLET | ORAL | Status: DC | PRN

## 2021-04-10 MED ORDER — FENTANYL CITRATE (PF) 100 MCG/2ML IJ SOLN
INTRAMUSCULAR | Status: AC
  Filled 2021-04-10: qty 2

## 2021-04-10 MED ORDER — ACETAMINOPHEN 325 MG PO TABS: 650.0000 mg | ORAL_TABLET | ORAL | Status: DC | PRN

## 2021-04-10 MED ORDER — SOD CITRATE-CITRIC ACID 500-334 MG/5ML PO SOLN
ORAL | Status: AC
  Administered 2021-04-10: 30 mL via ORAL
  Filled 2021-04-10: qty 30

## 2021-04-10 MED ORDER — LACTATED RINGERS IV SOLN: INTRAVENOUS | Status: DC | PRN

## 2021-04-10 MED ORDER — SODIUM CHLORIDE 0.9% IV SOLUTION: Freq: Once | INTRAVENOUS | Status: DC

## 2021-04-10 MED ORDER — CALCIUM CARBONATE ANTACID 500 MG PO CHEW: 2.0000 | CHEWABLE_TABLET | ORAL | Status: DC | PRN

## 2021-04-10 MED ORDER — OXYTOCIN-SODIUM CHLORIDE 30-0.9 UT/500ML-% IV SOLN: 2.5000 [IU]/h | INTRAVENOUS | Status: AC

## 2021-04-10 MED ORDER — BUPIVACAINE HCL (PF) 0.25 % IJ SOLN
INTRAMUSCULAR | Status: AC
Start: 2021-04-10 — End: ?
  Filled 2021-04-10: qty 10

## 2021-04-10 MED ORDER — KETOROLAC TROMETHAMINE 30 MG/ML IJ SOLN
30.0000 mg | Freq: Four times a day (QID) | INTRAMUSCULAR | Status: AC
  Administered 2021-04-11 (×4): 30 mg via INTRAVENOUS
  Filled 2021-04-10 (×4): qty 1

## 2021-04-10 MED ORDER — LACTATED RINGERS IV BOLUS
500.0000 mL | Freq: Once | INTRAVENOUS | Status: AC
  Administered 2021-04-10: 500 mL via INTRAVENOUS

## 2021-04-10 MED ORDER — BUPIVACAINE HCL (PF) 0.25 % IJ SOLN
INTRAMUSCULAR | Status: DC | PRN
  Administered 2021-04-10: 20 mL

## 2021-04-10 MED ORDER — METHYLERGONOVINE MALEATE 0.2 MG/ML IJ SOLN: 0.2000 mg | INTRAMUSCULAR | Status: DC | PRN

## 2021-04-10 MED ORDER — TRANEXAMIC ACID 1000 MG/10ML IV SOLN
INTRAVENOUS | Status: DC | PRN
  Administered 2021-04-10: 1000 mg via INTRAVENOUS

## 2021-04-10 MED ORDER — PRENATAL MULTIVITAMIN CH
1.0000 | ORAL_TABLET | Freq: Every day | ORAL | Status: DC
  Administered 2021-04-11 – 2021-04-13 (×3): 1 via ORAL
  Filled 2021-04-10 (×4): qty 1

## 2021-04-10 MED ORDER — MORPHINE SULFATE (PF) 0.5 MG/ML IJ SOLN
INTRAMUSCULAR | Status: AC
  Filled 2021-04-10: qty 10

## 2021-04-10 MED ORDER — IBUPROFEN 600 MG PO TABS
600.0000 mg | ORAL_TABLET | Freq: Four times a day (QID) | ORAL | Status: DC
  Administered 2021-04-12 – 2021-04-13 (×6): 600 mg via ORAL
  Filled 2021-04-10 (×7): qty 1

## 2021-04-10 MED ORDER — FENTANYL CITRATE (PF) 100 MCG/2ML IJ SOLN
INTRAMUSCULAR | Status: DC | PRN
  Administered 2021-04-10: 100 ug via INTRAVENOUS
  Administered 2021-04-10: 250 ug via INTRAVENOUS
  Administered 2021-04-10: 85 ug via INTRAVENOUS

## 2021-04-10 MED ORDER — MEPERIDINE HCL 25 MG/ML IJ SOLN: 6.2500 mg | INTRAMUSCULAR | Status: DC | PRN

## 2021-04-10 MED ORDER — DEXAMETHASONE SODIUM PHOSPHATE 10 MG/ML IJ SOLN
INTRAMUSCULAR | Status: DC | PRN
  Administered 2021-04-10: 10 mg via INTRAVENOUS

## 2021-04-10 MED ORDER — ACETAMINOPHEN 500 MG PO TABS
1000.0000 mg | ORAL_TABLET | Freq: Four times a day (QID) | ORAL | Status: DC
  Administered 2021-04-11 – 2021-04-13 (×8): 1000 mg via ORAL
  Filled 2021-04-10 (×11): qty 2

## 2021-04-10 MED ORDER — PRENATAL MULTIVITAMIN CH
1.0000 | ORAL_TABLET | Freq: Every day | ORAL | Status: DC
  Administered 2021-04-10: 1 via ORAL
  Filled 2021-04-10: qty 1

## 2021-04-10 MED ORDER — PROPOFOL 10 MG/ML IV BOLUS
INTRAVENOUS | Status: DC | PRN
  Administered 2021-04-10: 200 mg via INTRAVENOUS

## 2021-04-10 MED ORDER — BUPIVACAINE HCL (PF) 0.25 % IJ SOLN
INTRAMUSCULAR | Status: AC
  Filled 2021-04-10: qty 10

## 2021-04-10 MED ORDER — SUCCINYLCHOLINE CHLORIDE 200 MG/10ML IV SOSY
PREFILLED_SYRINGE | INTRAVENOUS | Status: DC | PRN
  Administered 2021-04-10: 160 mg via INTRAVENOUS

## 2021-04-10 MED ORDER — ZOLPIDEM TARTRATE 5 MG PO TABS: 5.0000 mg | ORAL_TABLET | Freq: Every evening | ORAL | Status: DC | PRN

## 2021-04-10 MED ORDER — ACETAMINOPHEN 10 MG/ML IV SOLN
INTRAVENOUS | Status: AC
  Filled 2021-04-10: qty 100

## 2021-04-10 MED ORDER — NIFEDIPINE 10 MG PO CAPS
10.0000 mg | ORAL_CAPSULE | ORAL | Status: AC
  Administered 2021-04-10 (×3): 10 mg via ORAL
  Filled 2021-04-10 (×3): qty 1

## 2021-04-10 MED ORDER — ZOLPIDEM TARTRATE 5 MG PO TABS
5.0000 mg | ORAL_TABLET | Freq: Every evening | ORAL | Status: DC | PRN
  Filled 2021-04-10: qty 1

## 2021-04-10 MED ORDER — WITCH HAZEL-GLYCERIN EX PADS: 1.0000 "application " | MEDICATED_PAD | CUTANEOUS | Status: DC | PRN

## 2021-04-10 MED ORDER — ACETAMINOPHEN 10 MG/ML IV SOLN
INTRAVENOUS | Status: DC | PRN
  Administered 2021-04-10: 1000 mg via INTRAVENOUS

## 2021-04-10 MED ORDER — SENNOSIDES-DOCUSATE SODIUM 8.6-50 MG PO TABS
2.0000 | ORAL_TABLET | Freq: Every day | ORAL | Status: DC
  Administered 2021-04-11 – 2021-04-13 (×3): 2 via ORAL
  Filled 2021-04-10 (×3): qty 2

## 2021-04-10 MED ORDER — FENTANYL CITRATE (PF) 250 MCG/5ML IJ SOLN
INTRAMUSCULAR | Status: AC
  Filled 2021-04-10: qty 5

## 2021-04-10 MED ORDER — TETANUS-DIPHTH-ACELL PERTUSSIS 5-2.5-18.5 LF-MCG/0.5 IM SUSY: 0.5000 mL | PREFILLED_SYRINGE | Freq: Once | INTRAMUSCULAR | Status: DC

## 2021-04-10 MED ORDER — CEFAZOLIN SODIUM-DEXTROSE 1-4 GM/50ML-% IV SOLN
INTRAVENOUS | Status: DC | PRN
  Administered 2021-04-10: 3 g via INTRAVENOUS

## 2021-04-10 MED ORDER — PROMETHAZINE HCL 25 MG/ML IJ SOLN: 6.2500 mg | INTRAMUSCULAR | Status: DC | PRN

## 2021-04-10 MED ORDER — OXYTOCIN-SODIUM CHLORIDE 30-0.9 UT/500ML-% IV SOLN
INTRAVENOUS | Status: DC | PRN
Start: 2021-04-10 — End: 2021-04-10
  Administered 2021-04-10: 30 [IU] via INTRAVENOUS

## 2021-04-10 MED ORDER — DOCUSATE SODIUM 100 MG PO CAPS
100.0000 mg | ORAL_CAPSULE | Freq: Every day | ORAL | Status: DC
  Administered 2021-04-10: 100 mg via ORAL
  Filled 2021-04-10: qty 1

## 2021-04-10 MED ORDER — ONDANSETRON HCL 4 MG/2ML IJ SOLN
INTRAMUSCULAR | Status: DC | PRN
  Administered 2021-04-10: 4 mg via INTRAVENOUS

## 2021-04-10 MED ORDER — DIBUCAINE (PERIANAL) 1 % EX OINT: 1.0000 "application " | TOPICAL_OINTMENT | CUTANEOUS | Status: DC | PRN

## 2021-04-10 MED ORDER — SIMETHICONE 80 MG PO CHEW: 80.0000 mg | CHEWABLE_TABLET | ORAL | Status: DC | PRN

## 2021-04-10 MED ORDER — SODIUM CHLORIDE 0.9 % IV SOLN: INTRAVENOUS | Status: DC | PRN

## 2021-04-10 MED ORDER — DIPHENHYDRAMINE HCL 25 MG PO CAPS: 25.0000 mg | ORAL_CAPSULE | Freq: Four times a day (QID) | ORAL | Status: DC | PRN

## 2021-04-10 MED ORDER — OXYCODONE HCL 5 MG PO TABS
5.0000 mg | ORAL_TABLET | ORAL | Status: DC | PRN
  Administered 2021-04-12: 5 mg via ORAL
  Filled 2021-04-10: qty 1

## 2021-04-10 MED ORDER — HYDROMORPHONE HCL 1 MG/ML IJ SOLN
0.2500 mg | INTRAMUSCULAR | Status: DC | PRN
  Administered 2021-04-10 (×4): 0.5 mg via INTRAVENOUS

## 2021-04-10 MED ORDER — BETAMETHASONE SOD PHOS & ACET 6 (3-3) MG/ML IJ SUSP
12.0000 mg | INTRAMUSCULAR | Status: DC
  Administered 2021-04-10: 12 mg via INTRAMUSCULAR
  Filled 2021-04-10: qty 5

## 2021-04-10 MED ORDER — ACETAMINOPHEN 10 MG/ML IV SOLN: 1000.0000 mg | Freq: Once | INTRAVENOUS | Status: DC | PRN

## 2021-04-10 MED ORDER — METHYLERGONOVINE MALEATE 0.2 MG/ML IJ SOLN
INTRAMUSCULAR | Status: DC | PRN
  Administered 2021-04-10: .2 mg via INTRAMUSCULAR

## 2021-04-10 MED ORDER — STERILE WATER FOR IRRIGATION IR SOLN
Status: DC | PRN
Start: 2021-04-10 — End: 2021-04-10
  Administered 2021-04-10: 1

## 2021-04-10 SURGICAL SUPPLY — 38 items
BENZOIN TINCTURE PRP APPL 2/3 (GAUZE/BANDAGES/DRESSINGS) ×1 IMPLANT
CHLORAPREP W/TINT 26ML (MISCELLANEOUS) ×2 IMPLANT
CLAMP CORD UMBIL (MISCELLANEOUS) IMPLANT
CLOSURE STERI STRIP 1/2 X4 (GAUZE/BANDAGES/DRESSINGS) ×1 IMPLANT
CLOTH BEACON ORANGE TIMEOUT ST (SAFETY) ×2 IMPLANT
DRSG OPSITE POSTOP 4X10 (GAUZE/BANDAGES/DRESSINGS) ×2 IMPLANT
ELECT REM PT RETURN 9FT ADLT (ELECTROSURGICAL) ×2
ELECTRODE REM PT RTRN 9FT ADLT (ELECTROSURGICAL) ×1 IMPLANT
EXTRACTOR VACUUM KIWI (MISCELLANEOUS) IMPLANT
GAUZE SPONGE 4X4 12PLY STRL LF (GAUZE/BANDAGES/DRESSINGS) ×2 IMPLANT
GLOVE BIOGEL PI IND STRL 7.0 (GLOVE) ×1 IMPLANT
GLOVE BIOGEL PI INDICATOR 7.0 (GLOVE) ×1
GLOVE SURG LTX SZ8 (GLOVE) ×2 IMPLANT
GOWN STRL REUS W/TWL LRG LVL3 (GOWN DISPOSABLE) ×4 IMPLANT
KIT ABG SYR 3ML LUER SLIP (SYRINGE) IMPLANT
NDL HYPO 25X5/8 SAFETYGLIDE (NEEDLE) IMPLANT
NDL SPNL 20GX3.5 QUINCKE YW (NEEDLE) IMPLANT
NEEDLE HYPO 22GX1.5 SAFETY (NEEDLE) ×2 IMPLANT
NEEDLE HYPO 25X5/8 SAFETYGLIDE (NEEDLE) IMPLANT
NEEDLE SPNL 20GX3.5 QUINCKE YW (NEEDLE) IMPLANT
NS IRRIG 1000ML POUR BTL (IV SOLUTION) ×2 IMPLANT
PACK C SECTION WH (CUSTOM PROCEDURE TRAY) ×2 IMPLANT
PAD ABD 7.5X8 STRL (GAUZE/BANDAGES/DRESSINGS) ×1 IMPLANT
PENCIL SMOKE EVAC W/HOLSTER (ELECTROSURGICAL) ×2 IMPLANT
SUT MNCRL 0 VIOLET CTX 36 (SUTURE) ×2 IMPLANT
SUT MNCRL AB 3-0 PS2 27 (SUTURE) IMPLANT
SUT MON AB 2-0 CT1 27 (SUTURE) ×2 IMPLANT
SUT MON AB-0 CT1 36 (SUTURE) ×4 IMPLANT
SUT MONOCRYL 0 CTX 36 (SUTURE) ×2
SUT PLAIN 0 NONE (SUTURE) IMPLANT
SUT PLAIN 2 0 (SUTURE)
SUT PLAIN 2 0 XLH (SUTURE) IMPLANT
SUT PLAIN ABS 2-0 CT1 27XMFL (SUTURE) IMPLANT
SYR 20CC LL (SYRINGE) IMPLANT
SYR CONTROL 10ML LL (SYRINGE) ×2 IMPLANT
TOWEL OR 17X24 6PK STRL BLUE (TOWEL DISPOSABLE) ×2 IMPLANT
TRAY FOLEY W/BAG SLVR 14FR LF (SET/KITS/TRAYS/PACK) ×2 IMPLANT
WATER STERILE IRR 1000ML POUR (IV SOLUTION) ×2 IMPLANT

## 2021-04-10 NOTE — Progress Notes (Signed)
Called by RN for contractions and review FHR tracing. Good FM. Contractions now irregular after procardia. ? ?BP 116/60 (BP Location: Right Arm)   Pulse (!) 101   Temp 98.4 ?F (36.9 ?C) (Oral)   Resp 20   Ht 5\' 9"  (1.753 m)   Wt 119.2 kg   SpO2 98%   BMI 38.81 kg/m?  ? ?FHR tracing c/w 140-150 with accels and BTBV 5-25, rare mild variable decel. ? ?No evidence of PTL ?FHR category 1 ?( .) ?

## 2021-04-10 NOTE — Anesthesia Procedure Notes (Signed)
Procedure Name: Intubation ?Date/Time: 04/10/2021 8:41 PM ?Performed by: Lenox Ahr, CRNA ?Pre-anesthesia Checklist: Patient identified, Emergency Drugs available, Suction available, Patient being monitored and Timeout performed ?Patient Re-evaluated:Patient Re-evaluated prior to induction ?Oxygen Delivery Method: Circle System Utilized ?Preoxygenation: Pre-oxygenation with 100% oxygen ?Induction Type: IV induction, Rapid sequence and Cricoid Pressure applied ?Laryngoscope Size: 3 and Glidescope ?Grade View: Grade I ?Tube type: Oral ?Tube size: 7.0 mm ?Number of attempts: 1 ?Airway Equipment and Method: stylet and Rigid stylet ?Placement Confirmation: ETT inserted through vocal cords under direct vision, positive ETCO2 and breath sounds checked- equal and bilateral ?Secured at: 21 cm ?Tube secured with: Tape ?Dental Injury: Teeth and Oropharynx as per pre-operative assessment  ? ? ? ? ?

## 2021-04-10 NOTE — MAU Provider Note (Signed)
History     CSN: [id number]  Arrival date and time: 04/10/21 0137   None     Chief Complaint  Patient presents with   Decreased Fetal Movement   HPI Bonnie Baker is a 36 y.o. G2P1001 at [redacted]w[redacted]d who presents to MAU for decreased fetal movement and contractions. Patient reports decreased fetal movement since 6-7pm. She has tried eating/drinking without any improvement. She reports she was able to fall asleep around 8pm, but woke up feeling uncomfortable and crampy. She reports cramping and pelvic pressure started several days ago but worsened tonight. She reports pain is every 6-7 minutes and rates pain 4/10. She denies leaking fluid, vaginal bleeding, or recent intercourse/pelvic exams.   Patient receives prenatal care at [facility]. Pregnancy is significant for previous c-section and marginal cord insertion  OB History     Gravida  2   Para  1   Term  1   Preterm      AB      Living  1      SAB      IAB      Ectopic      Multiple      Live Births              History reviewed. No pertinent past medical history.  Past Surgical History:  Procedure Laterality Date   ADENOIDECTOMY     SHOULDER ARTHROSCOPY     TYMPANOSTOMY TUBE PLACEMENT      Family History  Problem Relation Age of Onset   Allergic rhinitis Mother    Allergic rhinitis Brother    Allergic rhinitis Brother     Social History   Tobacco Use   Smoking status: Never   Smokeless tobacco: Never  Vaping Use   Vaping Use: Never used  Substance Use Topics   Alcohol use: Not Currently   Drug use: Never    Allergies: No Known Allergies  Medications Prior to Admission  Medication Sig Dispense Refill Last Dose   Prenatal 27-1 MG TABS Take 1 tablet by mouth daily.   04/09/2021   celecoxib (CELEBREX) 200 MG capsule Take 1 capsule (200 mg total) by mouth 2 (two) times daily. 20 capsule 0    cyclobenzaprine (FLEXERIL) 10 MG tablet Take 0.5-1 tablets (5-10 mg total) by mouth 2 (two)  times daily as needed for muscle spasms. 20 tablet 0    escitalopram (LEXAPRO) 20 MG tablet Take 20 mg by mouth daily.      Fluticasone Propionate (XHANCE) 93 MCG/ACT EXHU Place 2 sprays into the nose 2 (two) times daily as needed. 32 mL 5    ibuprofen (ADVIL) 200 MG tablet Take 600-800 mg by mouth every 6 (six) hours as needed.      pseudoephedrine (SUDAFED) 60 MG tablet Take 60 mg by mouth every 4 (four) hours as needed for congestion.       Review of Systems  Constitutional: Negative.   Respiratory: Negative.    Cardiovascular: Negative.   Gastrointestinal:  Positive for abdominal pain (cramping).  Genitourinary:  Positive for pelvic pain. Negative for dysuria, vaginal bleeding and vaginal discharge.  Musculoskeletal: Negative.   Neurological: Negative.    Physical Exam   Patient Vitals for the past 24 hrs:  BP Temp Temp src Pulse Resp SpO2 Height Weight  04/10/21 0400 114/89 -- -- -- -- -- -- --  04/10/21 0330 121/79 -- -- 83 -- 99 % -- --  04/10/21 0319 127/73 -- -- 85 -- 99 % -- --  04/10/21 0240 -- -- -- -- -- 99 % -- --  04/10/21 0230 -- -- -- -- -- 99 % -- --  04/10/21 0220 -- -- -- -- -- 99 % -- --  04/10/21 0215 -- -- -- -- -- 99 % -- --  04/10/21 0210 129/83 -- -- 89 -- 99 % -- --  04/10/21 0151 135/82 98.1 F (36.7 C) Oral 90 20 99 % 5\' 9"  (1.753 m) 119.2 kg    Physical Exam Vitals and nursing note reviewed. Exam conducted with a chaperone present.  Constitutional:      General: She is not in acute distress.    Appearance: She is obese.  Eyes:     Extraocular Movements: Extraocular movements intact.     Pupils: Pupils are equal, round, and reactive to light.  Cardiovascular:     Rate and Rhythm: Normal rate.  Pulmonary:     Effort: Pulmonary effort is normal.  Abdominal:     Palpations: Abdomen is soft.     Tenderness: There is no abdominal tenderness. There is no guarding.     Comments: Gravid   Genitourinary:    Comments: FFN collected followed by wet  prep and GC/CT. VE: closed Musculoskeletal:        General: Normal range of motion.     Cervical back: Normal range of motion.  Skin:    General: Skin is warm and dry.  Neurological:     General: No focal deficit present.     Mental Status: She is alert and oriented to person, place, and time.  Psychiatric:        Mood and Affect: Mood normal.        Behavior: Behavior normal.        Thought Content: Thought content normal.        Judgment: Judgment normal.   NST FHR: 145 bpm, moderate variability, +15x15 accels, no decels Toco: Q3 mins  [place] MFM FETAL BPP WO NON STRESS  Result Date: 04/10/2021 ----------------------------------------------------------------------  OBSTETRICS REPORT                        (Signed Final 04/10/2021 05:48 am) ---------------------------------------------------------------------- Patient Info  ID #:       [id number]                          D.O.B.:  1985-12-19 (36 yrs)  Name:       Bonnie Baker              Visit Date: 04/10/2021 03:21 am ---------------------------------------------------------------------- Performed By  Attending:        Johnell Comings MD         Secondary Phy.:    [facility] MAU/Triage  Performed By:     Dorena Dew     [facility] [place]  Referred By:      Renee Harder ---------------------------------------------------------------------- Orders  #  Description  Code        Ordered By  1  [place] MFM FETAL BPP WO NON               [id number]    Mariachristina Holle     STRESS ----------------------------------------------------------------------  #  Order #                     Accession #                Episode #  1  [id number]                   [id number]                 [id number] ---------------------------------------------------------------------- Indications  Decreased fetal movement                        O36.8190   [redacted] weeks gestation of pregnancy                 Z3A.33  Preterm contractions                            O47.00  Previous cesarean delivery, antepartum          O34.219 ---------------------------------------------------------------------- Fetal Evaluation  Num Of Fetuses:          1  Fetal Heart Rate(bpm):   160  Cardiac Activity:        Observed  Presentation:            Cephalic  Placenta:                Anterior  P. Cord Insertion:       Marginal insertion  Amniotic Fluid  AFI FV:      Subjectively increased  AFI Sum(cm)     %Tile  25.8            96  RUQ(cm)       RLQ(cm)       LUQ(cm)        LLQ(cm)  8.7           4.7           10.8           1.6  Comment:    Large hemorrhage collection noted 15.5 x 7.9 x 7.7cm.              Breathing noted x 1 episode, but not sustained. ---------------------------------------------------------------------- Biophysical Evaluation  Amniotic F.V:   Within normal limits       F. Tone:         Observed  F. Movement:    Observed                   Score:           6/8  F. Breathing:   Not Observed ---------------------------------------------------------------------- OB History  Gravidity:    2         Term:   1        Prem:   0        SAB:   0  TOP:          0       Ectopic:  0        Living: 1 ---------------------------------------------------------------------- Gestational Age  Clinical EDD:  33w 2d  EDD:   05/27/21  Best:          33w 2d     Det. By:  Clinical EDD             EDD:   05/27/21 ---------------------------------------------------------------------- Comments  This patient presented to the MAU due to decreased fetal  movements and lower abdominal cramping. She denies any  vaginal bleeding or abdominal trauma.  Her blood pressure in  the MAU was 135/82.  A biophysical profile performed today was 6 out of 8.  She  received a -2 for fetal breathing movements that did not meet  criteria. Her fetal heart rate tracing in the MAU showed  10 x  10 accelerations with no major decelerations.  Polyhydramnios with a total AFI of 25.8 cm is noted.  The anterior placenta appeared within normal limits.  There  appears to be an irregularly shaped mass located in the  gestational sac located next to the fetus which possibly may  be a blood clot.  As a placental abruption cannot be ruled out at this time, she  should be admitted to the hospital and placed on continuous  monitoring.  She should receive a course of antenatal corticosteroids.  Lewis labs and a P/C ratio should be obtained, along with a  beta-hCG level.  I will repeat the ultrasound later this morning to determine the  etiology of this mass and will make further management  recommendations at that time. ----------------------------------------------------------------------                   Johnell Comings, MD Electronically Signed Final Report   04/10/2021 05:48 am ----------------------------------------------------------------------   MAU Course  Procedures NST UA FFN, wet prep, GC/CT BPP Procardia series  MDM UA and wet prep unremarkable. FFN negative. Toco with contractions every 3 minutes. NST reassuring for gestational age. Cervix closed. Patient initially offered IVF bolus but declined. She was given PO fluids and the Procardia series without improvement in contractions. IVF bolus given. Toco with contractions still every 3 minutes. Cervix remains closed on repeat assessment.  Dr. Annamaria Boots called to discuss patient's ultrasound report. There is a solid mass of tissue within the gestational sac next to the fetus, concerning for possible blood clot. Placenta appears anterior and fundal, however abruption cannot be ruled out. Per MFM recommendations, admit to [facility] for CEFM and steroid course. Pre-e labs and bHCG ordered per MFM recommendations. Dr. Annamaria Boots will come in this morning to repeat ultrasound. Dr. Ronita Hipps notified of recommendations. Will admit to [facility]  Assessment and Plan  [redacted]  weeks gestation of pregnancy Preterm contractions  - Admit to [facility] - First dose of BMZ given  - Dr. Ronita Hipps to assume care of patient    Renee Harder, CNM 04/10/2021, 5:55 AM

## 2021-04-10 NOTE — MAU Note (Signed)
..  Bonnie Baker is a 36 y.o. at 100w2d here in MAU reporting: DFM and cramping. DFM for the last 3 hours. Pt reports she can not remember if she felt baby before she went to bed around 2000. Pt states she feels cramping that is very uncomfortable, however, feels much different than ctx like her last baby. Pt denies VB, LOF, abnormal discharge, recent intercourse, PIH s/,s, and complications in the pregnancy.  ?Marginal cord insertion  ? ?Onset of complaint: 2000 ?Pain score: 3/10 ?Vitals:  ? 04/10/21 0151  ?BP: 135/82  ?Pulse: 90  ?Resp: 20  ?Temp: 98.1 ?F (36.7 ?C)  ?SpO2: 99%  ?   ?FHT:145 ? ?Lab orders placed from triage:   UA ?

## 2021-04-10 NOTE — Anesthesia Procedure Notes (Signed)
Procedure Name: Intubation ?Date/Time: 04/10/2021 8:41 PM ?Performed by: Lewie Loron, MD ?Pre-anesthesia Checklist: Patient identified, Emergency Drugs available, Suction available and Patient being monitored ?Patient Re-evaluated:Patient Re-evaluated prior to induction ?Oxygen Delivery Method: Circle system utilized ?Preoxygenation: Pre-oxygenation with 100% oxygen ?Induction Type: IV induction ?Ventilation: Mask ventilation without difficulty ?Laryngoscope Size: Glidescope and 3 ?Grade View: Grade II ?Tube type: Oral ?Number of attempts: 1 ?Airway Equipment and Method: Stylet and Oral airway ?Placement Confirmation: ETT inserted through vocal cords under direct vision, positive ETCO2 and breath sounds checked- equal and bilateral ?Secured at: 22 cm ?Tube secured with: Tape ?Dental Injury: Teeth and Oropharynx as per pre-operative assessment  ? ? ? ? ?

## 2021-04-10 NOTE — Anesthesia Preprocedure Evaluation (Addendum)
Anesthesia Evaluation  ?Patient identified by MRN, date of birth, ID band ?Patient awake ? ? ? ?Reviewed: ?Allergy & Precautions, Patient's Chart, lab work & pertinent test results, Unable to perform ROS - Chart review onlyPreop documentation limited or incomplete due to emergent nature of procedure. ? ?Airway ?Mallampati: III ? ?TM Distance: >3 FB ?Neck ROM: Full ? ? ? Dental ?no notable dental hx. ?(+) Dental Advisory Given, Teeth Intact ?  ?Pulmonary ?neg pulmonary ROS,  ?  ?Pulmonary exam normal ?breath sounds clear to auscultation ? ? ? ? ? ? Cardiovascular ?negative cardio ROS ?Normal cardiovascular exam ?Rhythm:Regular Rate:Normal ? ? ?  ?Neuro/Psych ?negative neurological ROS ?   ? GI/Hepatic ?negative GI ROS, Neg liver ROS,   ?Endo/Other  ?negative endocrine ROS ? Renal/GU ?negative Renal ROS  ? ?  ?Musculoskeletal ?negative musculoskeletal ROS ?(+)  ? Abdominal ?(+) + obese,   ?Peds ? Hematology ?negative hematology ROS ?(+)   ?Anesthesia Other Findings ? ? Reproductive/Obstetrics ?(+) Pregnancy ? ?  ? ? ? ? ? ? ? ? ? ? ? ? ? ?  ?  ? ? ? ? ? ? ? ?Anesthesia Physical ?Anesthesia Plan ? ?ASA: 2 and emergent ? ?Anesthesia Plan: General  ? ?Post-op Pain Management: Toradol IV (intra-op)* and Ofirmev IV (intra-op)*  ? ?Induction: Intravenous, Rapid sequence and Cricoid pressure planned ? ?PONV Risk Score and Plan: 4 or greater and Ondansetron, Dexamethasone, Treatment may vary due to age or medical condition, Scopolamine patch - Pre-op and Promethazine ? ?Airway Management Planned: Oral ETT and Video Laryngoscope Planned ? ?Additional Equipment:  ? ?Intra-op Plan:  ? ?Post-operative Plan: Extubation in OR ? ?Informed Consent: I have reviewed the patients History and Physical, chart, labs and discussed the procedure including the risks, benefits and alternatives for the proposed anesthesia with the patient or authorized representative who has indicated his/her understanding and  acceptance.  ? ? ? ?Dental advisory given ? ?Plan Discussed with: CRNA ? ?Anesthesia Plan Comments:   ? ? ? ? ? ?Anesthesia Quick Evaluation ? ?

## 2021-04-10 NOTE — H&P (Signed)
History and Physical examination     CSN: [id number]  Arrival date and time: 04/10/21 0137   None     Chief Complaint  Patient presents with   Decreased Fetal Movement   HPI Bonnie Baker is a 36 y.o. G2P1001 at [redacted]w[redacted]d who presented to MAU for decreased fetal movement and contractions. Patient reported decreased fetal movement since 6-7pm. She has tried eating/drinking without any improvement. She reports she was able to fall asleep around 8pm, but woke up feeling uncomfortable and crampy. She reports cramping and pelvic pressure started several days ago but worsened tonight. She reports pain is every 6-7 minutes and rates pain 4/10. She denies leaking fluid, vaginal bleeding, or recent intercourse/pelvic exams.   Patient receives prenatal care at [facility]. Pregnancy is significant for previous c-section and marginal cord insertion. Per  review of previous op note, pt is a candidate for VBAC.   OB History     Gravida  2   Para  1   Term  1   Preterm      AB      Living  1      SAB      IAB      Ectopic      Multiple      Live Births              History reviewed. No pertinent past medical history.  Past Surgical History:  Procedure Laterality Date   ADENOIDECTOMY     SHOULDER ARTHROSCOPY     TYMPANOSTOMY TUBE PLACEMENT      Family History  Problem Relation Age of Onset   Allergic rhinitis Mother    Allergic rhinitis Brother    Allergic rhinitis Brother     Social History   Tobacco Use   Smoking status: Never   Smokeless tobacco: Never  Vaping Use   Vaping Use: Never used  Substance Use Topics   Alcohol use: Not Currently   Drug use: Never    Allergies: No Known Allergies  Medications Prior to Admission  Medication Sig Dispense Refill Last Dose   Prenatal 27-1 MG TABS Take 1 tablet by mouth daily.   04/09/2021   celecoxib (CELEBREX) 200 MG capsule Take 1 capsule (200 mg total) by mouth 2 (two) times daily. 20 capsule 0     cyclobenzaprine (FLEXERIL) 10 MG tablet Take 0.5-1 tablets (5-10 mg total) by mouth 2 (two) times daily as needed for muscle spasms. 20 tablet 0    escitalopram (LEXAPRO) 20 MG tablet Take 20 mg by mouth daily.      Fluticasone Propionate (XHANCE) 93 MCG/ACT EXHU Place 2 sprays into the nose 2 (two) times daily as needed. 32 mL 5    ibuprofen (ADVIL) 200 MG tablet Take 600-800 mg by mouth every 6 (six) hours as needed.      pseudoephedrine (SUDAFED) 60 MG tablet Take 60 mg by mouth every 4 (four) hours as needed for congestion.       Review of Systems  Constitutional: Negative.   Respiratory: Negative.    Cardiovascular: Negative.   Gastrointestinal:  Positive for abdominal pain (cramping).  Genitourinary:  Positive for pelvic pain. Negative for dysuria, vaginal bleeding and vaginal discharge.  Musculoskeletal: Negative.   Neurological: Negative.   All other systems reviewed and are negative.  Physical Exam   Patient Vitals for the past 24 hrs:  BP Temp Temp src Pulse Resp SpO2 Height Weight  04/10/21 0651 121/70 98.3 F (36.8 C) Oral  93 20 99 % -- --  04/10/21 0400 114/89 -- -- -- -- -- -- --  04/10/21 0330 121/79 -- -- 83 -- 99 % -- --  04/10/21 0319 127/73 -- -- 85 -- 99 % -- --  04/10/21 0240 -- -- -- -- -- 99 % -- --  04/10/21 0230 -- -- -- -- -- 99 % -- --  04/10/21 0220 -- -- -- -- -- 99 % -- --  04/10/21 0215 -- -- -- -- -- 99 % -- --  04/10/21 0210 129/83 -- -- 89 -- 99 % -- --  04/10/21 0151 135/82 98.1 F (36.7 C) Oral 90 20 99 % 5\' 9"  (1.753 m) 119.2 kg    CBC    Component Value Date/Time   WBC 15.9 (H) 04/10/2021 0500   RBC 3.89 04/10/2021 0500   HGB 12.0 04/10/2021 0500   HCT 35.5 (L) 04/10/2021 0500   PLT 235 04/10/2021 0500   MCV 91.3 04/10/2021 0500   MCH 30.8 04/10/2021 0500   MCHC 33.8 04/10/2021 0500   RDW 13.2 04/10/2021 0500   CMP Latest Ref Rng & Units 04/10/2021  Glucose 70 - 99 mg/dL 893(Y)  BUN 6 - 20 mg/dL 6  Creatinine 1.01 - 7.51 mg/dL 0.25   Sodium 852 - 778 mmol/L 135  Potassium 3.5 - 5.1 mmol/L 3.6  Chloride 98 - 111 mmol/L 105  CO2 22 - 32 mmol/L 21(L)  Calcium 8.9 - 10.3 mg/dL 8.9  Total Protein 6.5 - 8.1 g/dL 6.2(L)  Total Bilirubin 0.3 - 1.2 mg/dL 0.6  Alkaline Phos 38 - 126 U/L 81  AST 15 - 41 U/L 16  ALT 0 - 44 U/L 11     Physical Exam Vitals and nursing note reviewed. Exam conducted with a chaperone present.  Constitutional:      General: She is not in acute distress.    Appearance: She is obese.  HENT:     Head: Normocephalic and atraumatic.  Eyes:     Extraocular Movements: Extraocular movements intact.     Pupils: Pupils are equal, round, and reactive to light.  Cardiovascular:     Rate and Rhythm: Normal rate and regular rhythm.     Pulses: Normal pulses.     Heart sounds: Normal heart sounds.  Pulmonary:     Effort: Pulmonary effort is normal.  Abdominal:     General: Bowel sounds are normal.     Palpations: Abdomen is soft.     Tenderness: There is no abdominal tenderness. There is no guarding.     Comments: Gravid   Genitourinary:    General: Normal vulva.     Comments: FFN collected followed by wet prep and GC/CT. VE: closed Musculoskeletal:        General: Normal range of motion.     Cervical back: Normal range of motion and neck supple.  Skin:    General: Skin is warm and dry.  Neurological:     General: No focal deficit present.     Mental Status: She is alert and oriented to person, place, and time.  Psychiatric:        Mood and Affect: Mood normal.        Behavior: Behavior normal.        Thought Content: Thought content normal.        Judgment: Judgment normal.   NST FHR: 145 bpm, moderate variability, +15x15 accels, no decels Toco: Q3 mins  [place] MFM FETAL BPP WO NON STRESS  Result Date: 04/10/2021 ----------------------------------------------------------------------  OBSTETRICS REPORT                        (Signed Final 04/10/2021 05:48 am)  ---------------------------------------------------------------------- Patient Info  ID #:       [id number]                          D.O.B.:  [birth year] (36 yrs)  Name:       Bonnie Baker              Visit Date: 04/10/2021 03:21 am ---------------------------------------------------------------------- Performed By  Attending:        Ma RingsVictor Fang MD         [facility]  Performed By:     Earley BrookeNicole S Dalrymple     [facility]  Referred By:      Brand MalesANIELLE L                    SIMPSON ---------------------------------------------------------------------- Orders  #  Description                           Code        Ordered By  1  US MFM FETAL BPP WO NON               [id number]    DANIELLE SIMPSON     STRESS ----------------------------------------------------------------------  #  Order #                     Accession #                Episode #  1  [id number]                   [id number]                 [id number] ---------------------------------------------------------------------- Indications  Decreased fetal movement                        O36.8190  [redacted] weeks gestation of pregnancy                 Z3A.33  Preterm contractions                            O47.00  Previous cesarean delivery, antepartum          O34.219 ---------------------------------------------------------------------- Fetal Evaluation  Num Of Fetuses:          1  Fetal Heart Rate(bpm):   160  Cardiac Activity:        Observed  Presentation:            Cephalic  Placenta:                Anterior  P. Cord Insertion:       Marginal insertion  Amniotic Fluid  AFI FV:      Subjectively  increased  AFI Sum(cm)     %Tile  25.8            96  RUQ(cm)       RLQ(cm)       LUQ(cm)        LLQ(cm)  8.7           4.7           10.8           1.6  Comment:    Large hemorrhage collection noted 15.5 x 7.9 x 7.7cm.              Breathing noted x 1 episode,  but not sustained. ---------------------------------------------------------------------- Biophysical Evaluation  Amniotic F.V:   Within normal limits       F. Tone:         Observed  F. Movement:    Observed                   Score:           6/8  F. Breathing:   Not Observed ---------------------------------------------------------------------- OB History  Gravidity:    2         Term:   1        Prem:   0        SAB:   0  TOP:          0       Ectopic:  0        Living: 1 ---------------------------------------------------------------------- Gestational Age  Clinical EDD:  33w 2d                                        EDD:   05/27/21  Best:          33w 2d     Det. By:  Clinical EDD             EDD:   05/27/21 ---------------------------------------------------------------------- Comments  This patient presented to the MAU due to decreased fetal  movements and lower abdominal cramping. She denies any  vaginal bleeding or abdominal trauma.  Her blood pressure in  the MAU was 135/82.  A biophysical profile performed today was 6 out of 8.  She  received a -2 for fetal breathing movements that did not meet  criteria. Her fetal heart rate tracing in the MAU showed 10 x  10 accelerations with no major decelerations.  Polyhydramnios with a total AFI of 25.8 cm is noted.  The anterior placenta appeared within normal limits.  There  appears to be an irregularly shaped mass located in the  gestational sac located next to the fetus which possibly may  be a blood clot.  As a placental abruption cannot be ruled out at this time, she  should be admitted to the hospital and placed on continuous  monitoring.  She should receive a course of antenatal corticosteroids.  PIH labs and a P/C ratio should be obtained, along with a  beta-hCG level.  I will repeat the ultrasound later this morning to determine the  etiology of this mass and will make further management  recommendations at that time.  ----------------------------------------------------------------------                   Ma Rings, MD Electronically Signed Final Report   04/10/2021 05:48 am ----------------------------------------------------------------------   MAU Course  Assessment and Plan  33 3/7 wk IUP Preterm contractions- no evidence PTL Previous csection for active phase arrest History of LGA New onset- bleeding at site of MCI (per MFM) - no evidence of abruption or PEC.NL labs.  - Admit to [facility] - First dose of BMZ given - finish series Proceed with csection when BMZ complete per MFM.      Lenoard AdenAAVON,Jeralynn Vaquera J, CNM 04/10/2021, 9:19 AM

## 2021-04-10 NOTE — Consult Note (Signed)
MFM Note ? ?Bonnie Baker presented to the MAU early this morning due to decreased fetal movements and lower abdominal cramping. She denies any vaginal bleeding, abdominal trauma, or falls.  Her blood pressure in the MAU was 135/82.  She has a history of one prior cesarean delivery. ? ?The patient reports that she has had normal ultrasound exams prior to today including a normal fetal anatomy scan.  A marginal placental cord insertion was noted on her earlier exams. ? ?The overall EFW obtained today, 6 pounds (96 percentile) measures large for her gestational age.  ? ?Polyhydramnios with a total AFI of 25.8 cm is noted. ? ?A biophysical profile performed today was 6 out of 8.  She received a -2 for fetal breathing movements that did not meet criteria. Her fetal heart rate tracing in the MAU showed 10 x 10 accelerations with no major decelerations. ? ?The anterior placenta appeared within normal limits.   ? ?A marginal placental cord insertion continues to be noted on today's exam.  There appears to be a large blood clot within the gestational sac that is emanating from the marginal cord insertion site. ? ?The patient and her husband were advised that the large blood clot that is visualized within the gestational sac next to the fetus may be the result of a tear in the blood vessel within the marginal or velamentous cord insertion site. ? ?The fetal bowel appeared slightly echogenic possibly related to the fetus swallowing blood that is located in the amniotic fluid. ? ?They were reassured that as there were no signs of fetal hydrops noted today and as the MCA Doppler studies showed a normal peak systolic velocity value, that her baby is most likely not anemic at this time. ? ?Her PIH labs and her P/C ratio was negative for protein indicating that she does not have preeclampsia. ? ?Her beta-hCG level today was 37,129 (normal for her gestational age), making the mass within the intrauterine cavity unlikely to be  a molar pregnancy.   ? ?Based on her clinical presentation and the normal-appearing anterior placenta, I do not believe that placental abruption is present.  However, she may still be at significant risk for hemorrhage and fetal demise should the blood clot be dislodged from fetal movements and there is a tear in the vessel connected to the blood clot. ? ?Due to concerns for significant hemorrhage and fetal demise, I would recommend that she be hospitalized on continuous monitoring while she receives a complete course of antenatal corticosteroids.   ? ?At her current gestational age and based on the EFW obtained today, I would recommend that she be delivered via repeat C-section once she completes her steroid course in 2 days.  Delivery prior to this time is recommended should there be any concerns regarding the fetal status. ?   ?The patient and her husband understand that their baby will require a NICU admission for delivery at her current gestational age.  They are agreeable to delivery.   ? ?Her placenta should be sent to pathology after delivery. ?  ?The couple stated that all of their questions have been answered today. ?

## 2021-04-10 NOTE — Transfer of Care (Signed)
Immediate Anesthesia Transfer of Care Note ? ?Patient: Bonnie Baker ? ?Procedure(s) Performed: Repeat CESAREAN SECTION ? ?Patient Location: PACU ? ?Anesthesia Type:General ? ?Level of Consciousness: awake, alert , oriented and patient cooperative ? ?Airway & Oxygen Therapy: Patient Spontanous Breathing ? ?Post-op Assessment: Report given to RN, Post -op Vital signs reviewed and stable and Patient moving all extremities X 4 ? ?Post vital signs: Reviewed and stable ? ?Last Vitals:  ?Vitals Value Taken Time  ?BP 141/95 04/10/21 2145  ?Temp 36.3 ?C 04/10/21 2145  ?Pulse 103 04/10/21 2152  ?Resp 14 04/10/21 2150  ?SpO2 100 % 04/10/21 2152  ?Vitals shown include unvalidated device data. ? ?Last Pain:  ?Vitals:  ? 04/10/21 2145  ?TempSrc: Axillary  ?PainSc:   ?   ? ?  ? ?Complications: No notable events documented. ?

## 2021-04-10 NOTE — Op Note (Signed)
Cesarean Section Procedure Note ? ?Indications: abruptio placenta ? ?Pre-operative Diagnosis: 33 week 2 day pregnancy. ? ?Post-operative Diagnosis: same ? ?Surgeon: Lenoard Aden  ? ?Assistants: Conni Elliot, MD ? ?Anesthesia: General endotracheal anesthesia and Local anesthesia 0.25.% bupivacaine ? ?ASA Class: 2 ? ?Procedure Details  ?The patient was seen in the Holding Room. The risks, benefits, complications, treatment options, and expected outcomes were discussed with the patient.  The patient concurred with the proposed plan, giving informed consent. The risks of anesthesia, infection, bleeding and possible injury to other organs discussed. Injury to bowel, bladder, or ureter with possible need for repair discussed. Possible need for transfusion with secondary risks of hepatitis or HIV acquisition discussed. Post operative complications to include but not limited to DVT, PE and Pneumonia noted. The site of surgery properly noted/marked. The patient was taken to Operating Room # C, identified as Bonnie Baker and the procedure verified as C-Section Delivery. A Time Out was held and the above information confirmed. ? ?After induction of anesthesia, the patient was draped and prepped in the usual sterile manner. A Pfannenstiel incision was made and carried down through the subcutaneous tissue to the fascia. Fascial incision was made and extended transversely using Mayo scissors. The fascia was separated from the underlying rectus tissue superiorly and inferiorly. The peritoneum was identified and entered. Peritoneal incision was extended longitudinally. The utero-vesical peritoneal reflection was incised transversely and the bladder flap was bluntly freed from the lower uterine segment. A low transverse uterine incision(Kerr hysterotomy) was made. Bloody and copious AF noted with multiple clots.Delivered from OA presentation was a  female with Apgar scores of 8 at one minute and 9 at five minutes. Bulb suctioning  gently performed. Cord PH 7.27.Neonatal team in attendance.After the umbilical cord was clamped and cut cord blood was obtained for evaluation. The placenta was removed intact and appeared abnormal- with edematous UC and was sent to pathology. The uterus was curetted with a dry lap pack. Good hemostasis was noted after IM metergine.The uterine outline, tubes and ovaries appeared normal. The uterine incision was closed with running locked sutures of 0 Monocryl x 2 layers. Hemostasis was observed. Rectus muscles reapproximated with a  2-0 Monocryl suture. The fascia was then reapproximated with running sutures of 0 Monocryl. The skin was reapproximated with 3-0 monocryl after [place] closure with 2-0 plain. ? ?Instrument, sponge, and needle counts were correct prior the abdominal closure and at the conclusion of the case.  ? ?Findings: ?As noted. Greater than one liter bloody AF. Anterior placenta ? ?Estimated Blood Loss:   1500 (including AF) ?        ?Drains: foldy ?        ?        ?Specimens: placenta ?        ?        ?Complications:  None; patient tolerated the procedure well. ?        ?Disposition: PACU - guarded condition. ?        ?Condition: stable ? ?Attending Attestation: I performed the procedure. ? ? ? ?  ?

## 2021-04-10 NOTE — Consult Note (Signed)
[facility] ? ?Prenatal Consult       04/10/2021  3:44 PM ? ? ?I was asked by Taavon's team to consult on this patient for possible preterm delivery.  I had the pleasure of meeting with Mrs. Acton and her husband today.  She is a G2P1001 who was admitted at 33+2 today for decreased fetal movement, and MFM is now recommending a repeat C-section due to a large blood clot at the site of the marginal cord insertion/possible velamentous cord insertion which may represent tearing of the vessels.  Plan is for mom to receive 2 doses of betamethasone and schedule c-section for 33+4 ? ?I explained that the neonatal intensive care team would be present for the delivery and outlined the likely delivery room course for this baby including routine resuscitation and NRP-guided approaches to the treatment of respiratory distress. We discussed other common problems associated with prematurity including respiratory distress syndrome/CLD, apnea, feeding issues, temperature regulation, and infection risk.     ? ?We discussed the average length of stay but I noted that the actual LOS would depend on the severity of problems encountered and response to treatments.  We discussed visitation policies and the resources available while her child is in the hospital. ? ?We discussed the importance of good nutrition and various methods of providing nutrition (parenteral hyperalimentation, gavage feedings and/or oral feeding). We discussed the benefits of human milk. I encouraged breast feeding and pumping soon after birth and outlined resources that are available to support breast feeding.  Mother plans to provide milk.  ? ? ?Thank you for involving [place] in the care of this patient. A member of our team will be available should the family have additional questions.  Time for consultation approximately 20 minutes.  ? ?_____________________ ?Electronically Signed By: ?Karie Schwalbe, MD, MS ?Neonatologist   ?

## 2021-04-11 ENCOUNTER — Encounter (HOSPITAL_COMMUNITY): Payer: Self-pay | Admitting: Obstetrics and Gynecology

## 2021-04-11 DIAGNOSIS — Z98891 History of uterine scar from previous surgery: Secondary | ICD-10-CM

## 2021-04-11 DIAGNOSIS — Z8659 Personal history of other mental and behavioral disorders: Secondary | ICD-10-CM

## 2021-04-11 LAB — CBC
HCT: 31.1 % — ABNORMAL LOW (ref 36.0–46.0)
Hemoglobin: 10.4 g/dL — ABNORMAL LOW (ref 12.0–15.0)
MCH: 30.9 pg (ref 26.0–34.0)
MCHC: 33.4 g/dL (ref 30.0–36.0)
MCV: 92.3 fL (ref 80.0–100.0)
Platelets: 217 10*3/uL (ref 150–400)
RBC: 3.37 MIL/uL — ABNORMAL LOW (ref 3.87–5.11)
RDW: 13.4 % (ref 11.5–15.5)
WBC: 21.1 10*3/uL — ABNORMAL HIGH (ref 4.0–10.5)
nRBC: 0 % (ref 0.0–0.2)

## 2021-04-11 LAB — TYPE AND SCREEN
ABO/RH(D): A POS
Antibody Screen: NEGATIVE

## 2021-04-11 MED ORDER — FLUOXETINE HCL 20 MG PO CAPS
20.0000 mg | ORAL_CAPSULE | Freq: Every day | ORAL | Status: DC
  Administered 2021-04-11 – 2021-04-13 (×3): 20 mg via ORAL
  Filled 2021-04-11 (×3): qty 1

## 2021-04-11 NOTE — Progress Notes (Signed)
Patient screened out for psychosocial assessment since none of the following apply:  Psychosocial stressors documented in mother or baby's chart  Gestation less than 32 weeks  Code at delivery   Infant with anomalies Please contact the Clinical Social Worker if specific needs arise, by MOB's request, or if MOB scores greater than 9/yes to question 10 on Edinburgh Postpartum Depression Screen.  Tyrese Capriotti, LCSW Clinical Social Worker [facility] Cell#: [phone]     

## 2021-04-11 NOTE — Lactation Note (Signed)
This note was copied from a baby's chart. ?Lactation Consultation Note ? ?Patient Name: Bonnie Baker ?Today's Date: 04/11/2021 ?Reason for consult: Initial assessment;NICU baby;Preterm <34wks ?Age:36 hours ? ?Lactation conducted an initial consult with [name]. Her baby is 92 hours old. Her RN helped her initiate pumping earlier this morning, but she did have questions about the process. ? ?I reviewed her history; she has experience breast feeding and her goal is to breast feed baby [name]. ? ?I demonstrated how to assemble her pump parts, and I helped her use the initiate cycle on her breast pump. We checked her flanges and discussed how often to pump. I answered questions about how to promote healthy milk volumes through diet and pumping frequency. Mom had questions about when to begin power pumping. ? ?I showed [name] how to hand express. I recommended that she add some hand expression into her routine today to help stimulate the breasts. ? ?We are going to return to the discussion of a private pump tomorrow. She ordered one yesterday, but she will try to find out expected delivery date. She is not a candidate for [facility] (per mom), but we may be able to find her an alternative should her personal pump not arrive prior to discharge.  ? ?Maternal Data ?Has patient been taught Hand Expression?: Yes ?Does the patient have breastfeeding experience prior to this delivery?: Yes ?How long did the patient breastfeed?: up to one year ? ?Feeding ?Mother's Current Feeding Choice: Breast Milk and Donor Milk ? ?Lactation Tools Discussed/Used ?Tools: Pump;Flanges;Hands-free pumping top ?Flange Size: 27;24 (size 24 looks like a good fit; patient prefered the feel of size 27 on her left side) ?Breast pump type: Double-Electric Breast Pump ?Pump Education: Setup, frequency, and cleaning ?Reason for Pumping: NICU ?Pumping frequency: recommended q3 hours (minimum) ?Pumped volume: 0  mL ? ?Interventions ?Interventions: Breast feeding basics reviewed;Hand express;DEBP;Education;"The NICU and Your Baby" book;LC Services brochure ? ?Discharge ?Pump: Advised to call insurance company (ordered a lansinoh from insurance on 3/6 - will follow up with insurance to learn expected delivery date) ? ?Consult Status ?Consult Status: Follow-up ?Date: 04/11/21 ?Follow-up type: In-patient ? ? ? ?Walker Shadow ?04/11/2021, 9:54 AM ? ? ? ?

## 2021-04-11 NOTE — Progress Notes (Signed)
Subjective: [name]# 1 ?Live born female  ?Birth Weight: 5 lb 5.7 oz (2430 g) ?APGAR: 7, 9 ? ?Newborn Delivery   ?Birth date/time: 04/10/2021 20:43:00 ?Delivery type: C-Section, Low Transverse ?Trial of labor: No ?C-section categorization: Repeat ?  ?  ?Baby name: Bonnie Baker, stable in NICU ? ?Delivering provider: Olivia Baker  ? ?Circumcision: Planning prior to discharge ? ?Feeding: pumping colostrum for baby in NICU ? ?Pain control at delivery: General  ? ?Reports feeling ok after emergent delivery. Pain is controlled with PO meds. In NICU visiting baby Bonnie Baker who is doing well. Husband present and supportive.  ? ?Patient reports tolerating PO.   ?Pain controlled with prescription NSAID's including ketorolac (Toradol) ?Denies HA/SOB/C/P/N/V/dizziness. ?She reports vaginal bleeding as normal, without clots. She is ambulating and urinating without difficulty.    ? ?Objective:  ?Vitals:  ? 04/11/21 0257 04/11/21 0259 04/11/21 0415 04/11/21 0806  ?BP: 127/73 115/71 115/64 109/61  ?Pulse: 94 (!) 103 88 78  ?Resp:   18 18  ?Temp:   98 ?F (36.7 ?C) 98.1 ?F (36.7 ?C)  ?TempSrc:    Oral  ?SpO2:   97% 100%  ?Weight:      ?Height:      ? ?  ?Intake/Output Summary (Last 24 hours) at 04/11/2021 1007 ?Last data filed at 04/11/2021 9842 ?Gross per 24 hour  ?Intake 4176.06 ml  ?Output 3775 ml  ?Net 401.06 ml  ?    ?Recent Labs  ?  04/10/21 ?2045 04/11/21 ?0349  ?WBC 23.4* 21.1*  ?HGB 10.7* 10.4*  ?HCT 31.1* 31.1*  ?PLT 254 217  ? ? Blood type: --/--/A POS (03/06 2045) ? Rubella:    ? ?Physical Exam:  ?General: alert and cooperative ?CV: Regular rate and rhythm ?Resp: clear ?Abdomen: soft, nontender, normal bowel sounds ?Incision: clean and dry ?Uterine Fundus: firm, below umbilicus, nontender ?Lochia: minimal ?Ext: edema 2+ to lower extremities ? ?Assessment/Plan: ?36 y.o.   POD# 1. [id number]  ?                ?Principal Problem: ?  Postpartum care following cesarean delivery 3/6 ? Breastfeeding support ?Encourage to ambulate ?Routine post-op  care ?Emotional support for traumatic birth ?Active Problems: ?  Antepartum placental abruption ?  Placental abruption ? Labs stable, no signs of DIC ?  Status post repeat low transverse cesarean section - emergent for placental abruption ?   History of depression ? History of depression after G1, was on Prozac 20mg  daily, weaned prior to pregnancy ? Long conversation regarding traumatic birth experience and postpartum depression and patient desires to restart Prozac ? Will restart Prozac 20mg  daily ? Close PP F/U for mood ?  ? , MSN ?04/11/2021, 10:07 AM ? ?  ?

## 2021-04-12 LAB — CULTURE, BETA STREP (GROUP B ONLY)

## 2021-04-12 NOTE — Anesthesia Postprocedure Evaluation (Signed)
Anesthesia Post Note ? ?Patient: Bonnie Baker ? ?Procedure(s) Performed: Repeat CESAREAN SECTION ? ?  ? ?Patient location during evaluation: PACU ?Anesthesia Type: General ?Level of consciousness: sedated and patient cooperative ?Pain management: pain level controlled ?Vital Signs Assessment: post-procedure vital signs reviewed and stable ?Respiratory status: spontaneous breathing ?Cardiovascular status: stable ?Anesthetic complications: no ? ? ?No notable events documented. ? ?Last Vitals:  ?Vitals:  ? 04/12/21 0355 04/12/21 0927  ?BP: (!) 116/58 137/79  ?Pulse: 81 (!) 114  ?Resp: 18 17  ?Temp: 36.5 ?C 36.8 ?C  ?SpO2: 97% 100%  ?  ?Last Pain:  ?Vitals:  ? 04/12/21 0927  ?TempSrc: Oral  ?PainSc:   ? ? ?  ?  ?  ?  ?  ?  ? ?Lewie Loron ? ? ? ? ?

## 2021-04-12 NOTE — Progress Notes (Signed)
Subjective: [name]# 2 ?Live born female  ?Birth Weight: 5 lb 5.7 oz (2430 g) ?APGAR: 7, 9 ? ?Newborn Delivery   ?Birth date/time: [birth year]2021 20:43:00 ?Delivery type: C-Section, Low Transverse ?Trial of labor: No ?C-section categorization: Repeat ?  ?  ?[name], stable in NICU ? ?Delivering provider: Olivia Mackie  ? ?Circumcision Yes, planning ? ?Feeding: pumping for baby in NICU ? ?Pain control at delivery: General  ? ?Reports feeling tearful about discharge without baby. Will stay today and possible discharge tomorrow. Pain is well controlled with PO meds. Husband, Sharia Reeve, present and supportive.  ? ?Patient reports tolerating PO.   ?Pain controlled with narcotic analgesics including oxycodone (Oxycontin, Oxyir) ?Denies HA/SOB/C/P/N/V/dizziness. ?She reports vaginal bleeding as normal, without clots. She is ambulating and urinating without difficulty.    ? ?Objective:  ?Vitals:  ? 04/11/21 1801 04/11/21 2010 04/11/21 2350 04/12/21 0355  ?BP: 112/63 117/69 111/60 (!) 116/58  ?Pulse: 72 85 81 81  ?Resp:  16 16 18   ?Temp:  98 ?F (36.7 ?C) 98.3 ?F (36.8 ?C) 97.7 ?F (36.5 ?C)  ?TempSrc:  Oral Oral Oral  ?SpO2:  99% 99% 97%  ?Weight:      ?Height:      ? ?  ?Intake/Output Summary (Last 24 hours) at 04/12/2021 0733 ?Last data filed at 04/11/2021 1700 ?Gross per 24 hour  ?Intake --  ?Output 750 ml  ?Net -750 ml  ?    ?Recent Labs  ?  04/10/21 ?2045 04/11/21 ?0349  ?WBC 23.4* 21.1*  ?HGB 10.7* 10.4*  ?HCT 31.1* 31.1*  ?PLT 254 217  ? ? Blood type: --/--/A POS (03/06 2045) ? Rubella:    ? ?Physical Exam:  ?General: alert and cooperative ?CV: Regular rate and rhythm ?Resp: clear ?Abdomen: soft, nontender, normal bowel sounds ?Incision: clean and dry ?Uterine Fundus: firm, below umbilicus, nontender ?Lochia: minimal ?Ext: extremities normal, atraumatic, no cyanosis or edema ? ?Assessment/Plan: ?36 y.o.   POD# 2. 31  ?                ?Principal Problem: ?  Postpartum care following cesarean delivery 3/6 ? Encourage to  ambulate ?Routine post-op care ?Active Problems: ?  Antepartum placental abruption ?  Placental abruption ?  Status post repeat low transverse cesarean section - emergent for placental abruption ?  History of depression ? Continue Prozac 20mg  daily ? Close PP F/U for mood ? ?[id number], MSN ?04/12/2021, 7:33 AM ? ?  ?

## 2021-04-12 NOTE — Lactation Note (Signed)
This note was copied from a baby's chart. ? ?NICU Lactation Consultation Note ? ?Patient Name: Bonnie Baker ?Today's Date: 04/12/2021 ?Age:36 hours ? ?Subjective ?Reason for consult: Follow-up assessment; Primapara; 1st time breastfeeding; NICU baby; Preterm <34wks ? ?Lactation followed up with Ms. Wonder. She reports that she is pumping consistently, and that she did a power pumping session yesterday. She is beginning to obtain small volumes in her storage bottles at 39 hours postpartum. I praised her, and we reviewed the schedule and pumping best practices. I answered questions about maternal diet and lactogenic foods. ? ?She is interested in giving [place] opportunities to lick and learn at the breast. I followed up with SLP, Anise Salvo, post visit, and she indicated that [name] may move forward with breast time prior to 34 weeks. I followed up with Ms. Reindel, and SLP will check in with her as well later today. ? ?Objective ?Infant data: ?Mother's Current Feeding Choice: Breast Milk and Donor Milk ? ?Infant feeding assessment ?Scale for Readiness: 3 ? ?Maternal data: ?[id number]  ?C-Section, Low Transverse ?No data recorded ?Current breast feeding challenges:: NICU ? ?Previous breastfeeding challenges?: Other (Comment) (initial challenges with latching; resolved) ? ?Does the patient have breastfeeding experience prior to this delivery?: No ?How long did the patient breastfeed?: up to one year ? ?Pumping frequency: recommended q3 hours (minimum) ?Pumped volume: 5 mL (several mls - enough to coat the bottom of the storage bottle) ?Flange Size: 27; 24 (size 24 looks like a good fit; patient prefered the feel of size 27 on her left side) ? ?No data recorded ? ?Pump:  (verified that insurance pump will be delivered today; maternal discharge tomorrow) ? ?Assessment ?Infant: ?Feeding Status: NPO ? ? ?Maternal: ?Milk volume: Normal ? ? ?Intervention/Plan ?Interventions: Breast feeding basics reviewed; Education;  Skin to skin ? ?Tools: Pump; Flanges; Hands-free pumping top ?Pump Education: Setup, frequency, and cleaning ? ?Plan: ?Consult Status: Follow-up ? ?NICU Follow-up type: New admission follow up; Verify onset of copious milk; Verify absence of engorgement ? ?Walker Shadow ?04/12/2021, 11:50 AM ? ?Objective ?Infant data: ?Mother's Current Feeding Choice: Breast Milk and Donor Milk ? ?Infant feeding assessment ?Scale for Readiness: 3 ? ?Maternal data: ?[id number]  ?C-Section, Low Transverse ? ?Current breast feeding challenges:: NICU ? ?Previous breastfeeding challenges?: Other (Comment) (initial challenges with latching; resolved) ? ?Does the patient have breastfeeding experience prior to this delivery?: No ?How long did the patient breastfeed?: up to one year ? ?Pumping frequency: recommended q3 hours (minimum) ?Pumped volume: 5 mL (several mls - enough to coat the bottom of the storage bottle) ?Flange Size: 27; 24 (size 24 looks like a good fit; patient prefered the feel of size 27 on her left side) ? ? ?Pump:  (verified that insurance pump will be delivered today; maternal discharge tomorrow) ? ?Assessment ?Infant: ?Feeding Status: NPO ? ? ?Maternal: ?Milk volume: Normal ? ? ?Intervention/Plan ?Interventions: Breast feeding basics reviewed; Education; Skin to skin ? ?Tools: Pump; Flanges; Hands-free pumping top ?Pump Education: Setup, frequency, and cleaning ? ?Plan: ?Consult Status: Follow-up ? ?NICU Follow-up type: New admission follow up; Verify onset of copious milk; Verify absence of engorgement ? ? ? ?Walker Shadow ?04/12/2021, 11:44 AM ?

## 2021-04-13 LAB — TYPE AND SCREEN
ABO/RH(D): A POS
Antibody Screen: NEGATIVE
Unit division: 0
Unit division: 0

## 2021-04-13 LAB — BPAM RBC
Blood Product Expiration Date: 202303132359
Blood Product Expiration Date: 202303152359
Unit Type and Rh: 6200
Unit Type and Rh: 6200

## 2021-04-13 MED ORDER — OXYCODONE HCL 5 MG PO TABS: 5.0000 mg | ORAL_TABLET | ORAL | 0 refills | Status: AC | PRN

## 2021-04-13 MED ORDER — ACETAMINOPHEN 500 MG PO TABS: 1000.0000 mg | ORAL_TABLET | Freq: Four times a day (QID) | ORAL | 0 refills | Status: AC

## 2021-04-13 MED ORDER — FLUOXETINE HCL 20 MG PO CAPS: 20.0000 mg | ORAL_CAPSULE | Freq: Every day | ORAL | 3 refills | Status: AC

## 2021-04-13 MED ORDER — IBUPROFEN 800 MG PO TABS: 800.0000 mg | ORAL_TABLET | Freq: Three times a day (TID) | ORAL | 1 refills | Status: AC | PRN

## 2021-04-13 NOTE — Lactation Note (Signed)
This note was copied from a baby's chart. ? ?NICU Lactation Consultation Note ? ?Patient Name: Bonnie Baker ?Today's Date: 04/13/2021 ?Age:36 ? ? ?Subjective ?Reason for consult: Follow-up assessment; NICU baby; Preterm <34wks ?Lactation conducted a follow up with Ms. Arico, accompanied by Irving Burton, SLP. We had a conversation with Ms. Ferrufino about her breast feeding goals. She had some questions about what to expect with feeding skill development for preterm babies and when to expect baby to be able to breast feed exclusively.  ? ?Irving Burton and I provided education on LPI feeding patterns, feeding cues, stress cues, and signs of breast feeding readiness. I encouraged her to place baby STS at touch times and observe for feeding cues. Baby can nuzzle and practice latching as [name] shows readiness for this. ? ?I encouraged Ms. Kleinman to continue to pump on a consistent basis to protect her milk. She had a concern about baby's feeding volume at this GA, and the RN helped to educated on how feeding volume is calculated. I provided reassurance that her breast milk would increase and meet babies' intake needs as he continues to grow. Ms. Duford's plan is to exclusively breast feed, and Irving Burton and I emphasized the supports that we could provide as [name] develops his oral skills. ? ?Baby is beginning phototherapy today. ? ?Objective ?Infant data: ?Mother's Current Feeding Choice: Breast Milk and Donor Milk ? ?Infant feeding assessment ?Scale for Readiness: 2 ? ?Maternal data: ?[id number]  ?C-Section, Low Transverse ? ?Current breast feeding challenges:: NICU; preterm ? ? ?Does the patient have breastfeeding experience prior to this delivery?: Yes ? ?Pumping frequency: rec q3 hours (or more often) ?Pumped volume: 45 mL ? ? ?Pump:  (has a Medela borrowed from a friend to use; is awaiting her lansinoh pump to arrive from her insurance, which should be here any day now) ? ?Assessment ?Maternal: ?Milk  volume: Normal ? ? ?Intervention/Plan ?Interventions: Breast feeding basics reviewed; Skin to skin; Education ? ?Pump Education: Setup, frequency, and cleaning; Milk Storage; Other (comment) (discussed which storage bottles to use at home) ? ?Plan: ?Consult Status: Follow-up ? ?NICU Follow-up type: New admission follow up; Verify onset of copious milk; Verify absence of engorgement ? ? ? ?Walker Shadow ?04/13/2021, 11:42 AM ?

## 2021-04-13 NOTE — Discharge Summary (Signed)
?   ?  OB Discharge Summary ? ?Patient Name: Bonnie Baker ?DOB: January 28, 1986 ?MRN: [id number] ? ?Date of admission: 04/10/2021 ?Delivering MD: Olivia Mackie  ? ?Date of discharge: 04/13/2021 ? ?Admitting diagnosis: Antepartum placental abruption [O45.90] ?Placental abruption [O45.90] ?Intrauterine pregnancy: [redacted]w[redacted]d     ?Secondary diagnosis:Principal Problem: ?  Postpartum care following cesarean delivery 3/6 ?Active Problems: ?  Antepartum placental abruption ?  Placental abruption ?  Status post repeat low transverse cesarean section - emergent for placental abruption ?  History of depression ? ?Additional problems: None  ?   ?Discharge diagnosis: Preterm Pregnancy Delivered       ?                                                              ?Post partum procedures: None ? ?Augmentation: N/A ? ?Complications: Placental Abruption ? ?Hospital course: Patient admitted for third trimester vaginal bleeding.  Ultrasound consistent with tear at the marginal cord insertion site.  Patient received betamethasone and continuous monitoring.  That evening patient started having increased vaginal bleeding and stat C-section was done.  He can see that op note in a separate note.  In short bloody amniotic fluid with large clots in the amniotic space with markedly edematous umbilical cord.  Baby did well and was taken to the NICU ? ?Patient had an uncomplicated postpartum recovery ? ?On day of discharge patient notes concern given her history of depression and anxiety.  She was restarted on her antidepressants which she stopped upon morning of the pregnancy.  Patient notes significant baby blues after her last pregnancy and accepts short-term outpatient follow-up.  Otherwise patient reports no chest pain, no shortness of breath, no dizziness.  Patient has no bowel movement yet but does have flatus.  Patient is tolerating regular p.o.  Patient is having normal voids.  Patient is pumping for her baby in the NICU.  Patient notes pain  controlled mostly with nonnarcotic medications. ? ? ? ?Physical exam  ?Vitals:  ? 04/12/21 1936 04/12/21 2334 04/13/21 0324 04/13/21 0730  ?BP: 120/68 123/76 103/76 130/67  ?Pulse: 76 81 83 86  ?Resp: 16 18 18 18   ?Temp: 98.2 ?F (36.8 ?C) 97.7 ?F (36.5 ?C) 98.2 ?F (36.8 ?C) 98.1 ?F (36.7 ?C)  ?TempSrc: Oral Oral Oral Oral  ?SpO2: 100% 100% 100% 100%  ?Weight:      ?Height:      ? ?General: alert, cooperative, and no distress ?Lochia: appropriate ?Uterine Fundus: firm ?Incision: Healing well with no significant drainage ?DVT Evaluation: No evidence of DVT seen on physical exam. ?Labs: ?Lab Results  ?Component Value Date  ? WBC 21.1 (H) 04/11/2021  ? HGB 10.4 (L) 04/11/2021  ? HCT 31.1 (L) 04/11/2021  ? MCV 92.3 04/11/2021  ? PLT 217 04/11/2021  ? ?CMP Latest Ref Rng & Units 04/10/2021  ?Glucose 70 - 99 mg/dL 06/10/2021)  ?BUN 6 - 20 mg/dL 6  ?Creatinine 0.44 - 1.00 mg/dL 254(Y  ?Sodium 135 - 145 mmol/L 135  ?Potassium 3.5 - 5.1 mmol/L 3.6  ?Chloride 98 - 111 mmol/L 105  ?CO2 22 - 32 mmol/L 21(L)  ?Calcium 8.9 - 10.3 mg/dL 8.9  ?Total Protein 6.5 - 8.1 g/dL 6.2(L)  ?Total Bilirubin 0.3 - 1.2 mg/dL 0.6  ?Alkaline Phos 38 - 126  U/L 81  ?AST 15 - 41 U/L 16  ?ALT 0 - 44 U/L 11  ? ? ?Discharge instruction: per After Visit Summary and "Baby and Me Booklet". ? ?After Visit Meds:  ?Allergies as of 04/13/2021   ?No Known Allergies ?  ? ?  ?Medication List  ?  ? ?STOP taking these medications   ? ?celecoxib 200 MG capsule ?Commonly known as: CeleBREX ?  ?cyclobenzaprine 10 MG tablet ?Commonly known as: FLEXERIL ?  ?escitalopram 20 MG tablet ?Commonly known as: LEXAPRO ?  ?Prenatal 27-1 MG Tabs ?  ?pseudoephedrine 60 MG tablet ?Commonly known as: SUDAFED ?  ?Xhance 93 MCG/ACT Exhu ?Generic drug: Fluticasone Propionate ?  ? ?  ? ?TAKE these medications   ? ?acetaminophen 500 MG tablet ?Commonly known as: TYLENOL ?Take 2 tablets (1,000 mg total) by mouth every 6 (six) hours. ?  ?FLUoxetine 20 MG capsule ?Commonly known as: PROZAC ?Take 1  capsule (20 mg total) by mouth daily. ?Start taking on: April 14, 2021 ?  ?ibuprofen 800 MG tablet ?Commonly known as: ADVIL ?Take 1 tablet (800 mg total) by mouth every 8 (eight) hours as needed. ?What changed:  ?medication strength ?how much to take ?when to take this ?  ?oxyCODONE 5 MG immediate release tablet ?Commonly known as: Oxy IR/ROXICODONE ?Take 1-2 tablets (5-10 mg total) by mouth every 4 (four) hours as needed for moderate pain. ?  ? ?  ? ? ?Diet: routine diet ? ?Activity: Advance as tolerated. Pelvic rest for 6 weeks.  ? ?Outpatient follow up:1 week ?Follow up Appt:No future appointments. ?Follow up visit: No follow-ups on file. ? ?Postpartum contraception: Not Discussed ? ?Newborn Data: ?Live born female  ?Birth Weight: 5 lb 5.7 oz (2430 g) ?APGAR: 7, 9 ? ?Newborn Delivery   ?Birth date/time: 04/10/2021 20:43:00 ?Delivery type: C-Section, Low Transverse ?Trial of labor: No ?C-section categorization: Repeat ?  ?  ? ?Baby Feeding: Bottle and pumping ?Disposition:NICU ? ? ?04/13/2021 ?Lendon Colonel, MD ? ? ?

## 2021-04-13 NOTE — Plan of Care (Signed)
?  Problem: Health Behavior/Discharge Planning: ?Goal: Ability to manage health-related needs will improve ?Outcome: Adequate for Discharge ?  ?Problem: Clinical Measurements: ?Goal: Ability to maintain clinical measurements within normal limits will improve ?Outcome: Adequate for Discharge ?Goal: Will remain free from infection ?Outcome: Adequate for Discharge ?Goal: Cardiovascular complication will be avoided ?Outcome: Adequate for Discharge ?  ?Problem: Nutrition: ?Goal: Adequate nutrition will be maintained ?Outcome: Adequate for Discharge ?  ?Problem: Coping: ?Goal: Level of anxiety will decrease ?Outcome: Adequate for Discharge ?  ?Problem: Elimination: ?Goal: Will not experience complications related to bowel motility ?Outcome: Adequate for Discharge ?Goal: Will not experience complications related to urinary retention ?Outcome: Adequate for Discharge ?  ?Problem: Pain Managment: ?Goal: General experience of comfort will improve ?Outcome: Adequate for Discharge ?  ?Problem: Safety: ?Goal: Ability to remain free from injury will improve ?Outcome: Adequate for Discharge ?  ?Problem: Education: ?Goal: Individualized Educational Video(s) ?Outcome: Adequate for Discharge ?  ?Problem: Education: ?Goal: Knowledge of condition will improve ?Outcome: Adequate for Discharge ?Goal: Individualized Educational Video(s) ?Outcome: Adequate for Discharge ?Goal: Individualized Newborn Educational Video(s) ?Outcome: Adequate for Discharge ?  ?Problem: Activity: ?Goal: Will verbalize the importance of balancing activity with adequate rest periods ?Outcome: Adequate for Discharge ?Goal: Ability to tolerate increased activity will improve ?Outcome: Adequate for Discharge ?  ?Problem: Coping: ?Goal: Ability to identify and utilize available resources and services will improve ?Outcome: Adequate for Discharge ?  ?Problem: Life Cycle: ?Goal: Chance of risk for complications during the postpartum period will decrease ?Outcome:  Adequate for Discharge ?  ?Problem: Role Relationship: ?Goal: Ability to demonstrate positive interaction with newborn will improve ?Outcome: Adequate for Discharge ?  ?Problem: Skin Integrity: ?Goal: Demonstration of wound healing without infection will improve ?Outcome: Adequate for Discharge ?  ?

## 2021-04-14 LAB — SURGICAL PATHOLOGY

## 2021-04-15 ENCOUNTER — Ambulatory Visit: Payer: Self-pay

## 2021-04-15 NOTE — Lactation Note (Signed)
This note was copied from a baby's chart. ? ?NICU Lactation Consultation Note ? ?Patient Name: Bonnie Baker ?Today's Date: 04/15/2021 ?Age:36 days ? ? ?Subjective ?Reason for consult: Follow-up assessment ?Mother continues to pump frequently and with increasing volumes. She denies breast over-fullness or pain. We reviewed feeding norms at 34 weeks and IDF algorithm.  ? ?Objective ?Infant data: ?Mother's Current Feeding Choice: Breast Milk ? ?Infant feeding assessment ?Scale for Readiness: 3 ?  ?Maternal data: ?[id number]  ?C-Section, Low Transverse ?Pumping frequency: q3 ?Pumped volume: 25 mL ? ? ?Pump:  (has a Medela borrowed from a friend to use; is awaiting her lansinoh pump to arrive from her insurance, which should be here any day now) ? ?Assessment ?Maternal: ?Milk volume: Normal ? ? ?Intervention/Plan ?Interventions: Education; Infant Driven Feeding Algorithm education ? ?Plan: ?Consult Status: Follow-up ? ?NICU Follow-up type: Assist with IDF-1 (Mother to pre-pump before breastfeeding); Weekly NICU follow up ? ?Mother to continue pumping q3 and bring any EBM to NICU.  ? ?Elder Negus ?04/15/2021, 11:44 AM ?

## 2021-04-20 ENCOUNTER — Telehealth (HOSPITAL_COMMUNITY): Payer: Self-pay | Admitting: *Deleted

## 2021-04-20 ENCOUNTER — Ambulatory Visit: Payer: Self-pay

## 2021-04-20 NOTE — Telephone Encounter (Signed)
Mom reports feeling good. Incision healing well. No concerns about herself at this time. EPDS=8 [facility] no score) ?Mom reports baby is doing well, but still in the NICU.  ? ?Duffy Rhody, RN 04-20-2021 at 11:54am ?

## 2021-04-20 NOTE — Lactation Note (Signed)
This note was copied from a baby's chart. ? ?NICU Lactation Consultation Note ? ?Patient Name: Bonnie Baker ?Today's Date: 04/20/2021 ?Age:36 days ? ? ?Subjective ?Reason for consult: Follow-up assessment; NICU baby; Preterm <34wks ? ?Lactation followed up with Ms. Saling. She attempted to latch baby [name] on the left breast in cross cradle hold. I reviewed "U" hold hand position. Baby [name] was yawning with intermittent and brief rooting cues. He did not latch this session. Ms. Tyson and her husband state good understanding of preterm feeding patterns. ? ?I mentioned the option of introducing a nipple shield and stated that I had consulted with SLP, Dala Dock. Ms. Counce states that she has previous experience with the NS with her daughter, and was able to wean from it and breast feed. At this time, she would prefer to hold off on offering a nipple shield. I validated her concerns, and stated that we could always revisit it later, if needed. ? ?Ms. Goon continues to pump consistently. I will follow up this Sunday, as able.  ? ?Objective ?Infant data: ?Mother's Current Feeding Choice: Breast Milk ? ?Infant feeding assessment ?Scale for Readiness: 3 ? ?Maternal data: ?[id number]  ?C-Section, Low Transverse ? ?Previous breastfeeding challenges?: Other (Comment) (started with a nipple shield but successfully weaned from it) ? ?Does the patient have breastfeeding experience prior to this delivery?: Yes ? ?Pumping frequency: q3 hours ?Pumped volume: 45 mL (45-60) ? ? ?Pump: Personal (Lansinoh) ? ?Assessment ?Infant: ?LATCH Score: 6 ? ? ?Maternal: ?Milk volume: Normal ? ? ?Intervention/Plan ?Interventions: Breast feeding basics reviewed; Skin to skin; Hand express; Breast compression; Adjust position; Support pillows; Education ? ?Tools: Pump ?Pump Education: Setup, frequency, and cleaning ? ?Plan: ?Consult Status: Follow-up ? ?NICU Follow-up type: Weekly NICU follow up; Assist with IDF-1  (Mother to pre-pump before breastfeeding) ? ? ? ?Walker Shadow ?04/20/2021, 11:22 AM ?

## 2021-04-24 ENCOUNTER — Ambulatory Visit: Payer: Self-pay

## 2021-04-24 NOTE — Lactation Note (Signed)
This note was copied from a baby's chart. ?Lactation Consultation Note ? ?Patient Name: Bonnie Baker ?Today's Date: 04/24/2021 ?Reason for consult: Follow-up assessment;NICU baby;Late-preterm 34-36.6wks;Infant < 6lbs;Other (Comment) (AMA) ?Age:36 wk.o. ? ?Visited with mom of 50 33/24 weeks old (adjusted) NICU female, she's a P2 and experienced breastfeeding. She's been taking baby "Bonnie Baker" to breast about 6 times the last 24 hours whenever he's cueing around his feeding times. Ms. Elbert voiced that baby has been latching on without the NS and she wishes to continue working on breastfeeding without one. ? ?Offered assistance with latch around baby's feeding time; she said said she'll call out in case she needs assistance but so far she feels comfortable taking baby to breast on her own. Baby is going the IDF algorithm (IDF 2) already, noticed no weight gain the last 24 hours. Reviewed LPI behavior, feeding cues, sleep and feeding patterns and IDF education.  ? ?Feeding ?Mother's Current Feeding Choice: Breast Milk ? ?Lactation Tools Discussed/Used ?Tools: Pump;Flanges ?Flange Size: 24;27 ?Breast pump type: Double-Electric Breast Pump ?Pump Education: Setup, frequency, and cleaning;Milk Storage ?Reason for Pumping: LPI in NICU ?Pumping frequency: 8 times/24 hours ?Pumped volume: 45 mL (45-60 ml but that's after baby has fed at the breast) ? ?Interventions ?Interventions: Breast feeding basics reviewed;DEBP;Education;Infant Driven Feeding Algorithm education ? ?Plan of care ?Encouraged mom to continue pumping consistently every 3 hours, she's now pumping after feedings/attempts at the breast, even at night time ?She'll continue taking baby to breast on feeding cues at feedings times and will call for latch assistance when needed ? ?Family friend present and holding baby. All questions and concerns answered, family to call NICU LC PRN. ? ?Discharge ?Pump: DEBP;Personal (Lansinoh) ? ?Consult Status ?Consult  Status: Follow-up ?Date: 04/24/21 ?Follow-up type: In-patient ? ? ?Kendle Turbin S Derinda Bartus ?04/24/2021, 10:37 AM ? ? ? ?

## 2021-04-27 ENCOUNTER — Ambulatory Visit: Payer: Self-pay

## 2021-04-27 NOTE — Lactation Note (Signed)
This note was copied from a baby's chart. ? ?NICU Lactation Consultation Note ? ?Patient Name: Bonnie Baker ?Today's Date: 04/27/2021 ?Age:36 wk.o. ? ? ?Subjective ?Reason for consult: Follow-up assessment; Mother's request; NICU baby; Preterm <34wks ? ?Lactation followed up with Ms. Barner and baby Bonnie Baker. I observed her latch him to the right breast in cradle hold. He latched easily, and I noted rhythmic suckling sequences. He became fatigued after a few minutes. Ms. Lieder remarked that he usually stays more engaged. She sat him up and burped him and then offered the left breast. He was too sleepy to latch to this side at this attempt.  ? ?We discussed his remarkable growth in skills over the last week. I encouraged her to breast feed baby as much as possible to help him develop his skills. Baby is currently on the IDF protocol.  ? ?Ms. Tramble states that she is pumping approximately 2.5 ounces. I encouraged her to continue post-pumping, particularly after poor feeding attempts. We discussed indications for a good feeding a the breast; Ms. Lastinger will pump to protect her milk supply. I educated on LPI feeding patterns and encouraged maintaining her pumping regimen. ? ?Baby took his first bottle at 1100 today. Ms. Petrenko asked me to pass along a question regarding the appropriate bottle nipple for [facility]. After my consult with her, I followed up with Rae Roam. She will follow up with Ms. Marion. ? ?Lactation follow up recommended within next 48 hours. ? ?Objective ?Infant data: ?Mother's Current Feeding Choice: Breast Milk ? ?Infant feeding assessment ?Scale for Readiness: 2 ?Scale for Quality: 4 ? ? ?Maternal data: ?[id number]  ?C-Section, Low Transverse ? ? ?Pump: Personal ? ?Assessment ?Infant: ?LATCH Score: 8 ? ?Feeding Status: IDF-2 ? ? ?Intervention/Plan ? ?Plan: ?Consult Status: Follow-up ?Continue to encourage Bonnie Baker to breast feed and to work on his skills at the  breast. ?Continue to pump to protect breast milk while Bonnie Baker is learning to breast feed. ? ? ?Walker Shadow ?04/27/2021, 4:08 PM ?

## 2021-04-28 ENCOUNTER — Ambulatory Visit: Payer: Self-pay

## 2021-04-28 NOTE — Lactation Note (Signed)
This note was copied from a baby's chart. ? ?NICU Lactation Consultation Note ? ?Patient Name: Bonnie Baker ?Today's Date: 04/28/2021 ?Age:36 wk.o. ? ? ?Subjective ?Reason for consult: Follow-up assessment ?LC was present as mom independently bf infant. We reviewed nutritive vs non-nutritive suck and ad lib feeding. Mom is aware of the need to encourage infant to wake and feed prn.  ? ?We briefly discussed discharge planning and weight gain norms. ? ?I suggested that mom limit pacifier use to increase opportunity for additional suckling time at breast until consistent weight gain is achieved with EBF. ? ?Objective ?Infant data: ?Mother's Current Feeding Choice: Breast Milk ? ?Infant feeding assessment ?Scale for Readiness: 1 ?Scale for Quality: 2 ? ? ?Maternal data: ?[id number]  ?C-Section, Low Transverse ? ?Pump: Personal ? ?Assessment ?Infant: ?LATCH Score: 10 ? ? ?Intervention/Plan ?Interventions: Education ? ?Plan: ?Consult Status: Follow-up ? ?NICU Follow-up type: -- (begin discharge planning) ? ?Mother to offer breast on demand and wake to feed if number of daily feeds dips below 8/day or if baby sleeps for 4 hours. ? ? ?Elder Negus ?04/28/2021, 11:51 AM ?

## 2021-05-01 ENCOUNTER — Ambulatory Visit: Payer: Self-pay

## 2021-05-01 NOTE — Lactation Note (Signed)
This note was copied from a baby's chart. ?Lactation Consultation Note ? ?Patient Name: Bonnie Baker ?Today's Date: 05/01/2021 ?Reason for consult: Follow-up assessment;Late-preterm 34-36.6wks;Other (Comment) (AMA) ?Age:36 wk.o. ? ?Subjective ? ? Reason for consult: anticipated discharge  ?LC and student Jeromiah Ohalloran visited with Mom and baby asleep upon entering room ? ?Objective ?Mother's Current Feeding Choice: Breast Milk ?Mother states she is mainly nursing baby and pumping 3-4x a day with volume (2oz) as she prefers nursing over bottle feed. Mom feels that her experience will be better once she is home; feels confident in baby's good latch and she has seen a nutritive suck at beginning of latch but then baby fatigues. ? ?Assessment ? ? Mom had 2 small bottles in fridge 60 ml bottle 1, 30 ml bottle 2. Mom states that baby typically nurses about 20 min. ? ? Lactation Tools Discussed/Used ?Tools: Pump ?Breast pump type: Double-Electric Breast Pump ?Pump Education: Setup, frequency, and cleaning;Milk Storage ?Reason for Pumping: NICU ?Pumping frequency: As per Mom 3-4x a day ?Pumped volume: 60 mL (After baby nurses Mom is able to pump 15-2ml) ? ?Interventions ?Interventions: Education;DEBP ? ?Discharge ?Discharge Education: Outpatient recommendation;Outpatient Epic message sent (Suggestion to Mom as per Speech to attempt full removal of milk at one breast instead of offering both and then pump after nursing baby.Mom states that she's confident baby will do well at home with nursing and bottles/supplies she already has) ?Pump: DEBP;Personal (Personal pump Lansinoh) ? ?Consult Status ?Consult Status: Complete ?Date: 05/01/21 ?Follow-up type: Call as needed ? ? ?Preeya Cleckley ?05/01/2021, 11:29 AM ? ? ? ?

## 2021-05-09 ENCOUNTER — Encounter (HOSPITAL_COMMUNITY): Payer: Self-pay

## 2021-05-09 ENCOUNTER — Observation Stay (HOSPITAL_COMMUNITY)
Admission: EM | Admit: 2021-05-09 | Discharge: 2021-05-10 | Disposition: A | Discharge status: Home / Self Care | Payer: 59 | Attending: Surgery | Admitting: Surgery

## 2021-05-09 ENCOUNTER — Emergency Department (HOSPITAL_COMMUNITY): Payer: 59

## 2021-05-09 ENCOUNTER — Other Ambulatory Visit: Payer: Self-pay

## 2021-05-09 DIAGNOSIS — K819 Cholecystitis, unspecified: Principal | ICD-10-CM | POA: Diagnosis present

## 2021-05-09 DIAGNOSIS — R109 Unspecified abdominal pain: Secondary | ICD-10-CM | POA: Diagnosis present

## 2021-05-09 DIAGNOSIS — R079 Chest pain, unspecified: Secondary | ICD-10-CM | POA: Insufficient documentation

## 2021-05-09 HISTORY — DX: Depression, unspecified: F32.A

## 2021-05-09 HISTORY — DX: Headache, unspecified: R51.9

## 2021-05-09 HISTORY — DX: Anxiety disorder, unspecified: F41.9

## 2021-05-09 LAB — BASIC METABOLIC PANEL
Anion gap: 8 (ref 5–15)
BUN: 16 mg/dL (ref 6–20)
CO2: 25 mmol/L (ref 22–32)
Calcium: 8.9 mg/dL (ref 8.9–10.3)
Chloride: 106 mmol/L (ref 98–111)
Creatinine, Ser: 0.87 mg/dL (ref 0.44–1.00)
GFR, Estimated: >60 mL/min (ref >60)
Glucose, Bld: 97 mg/dL (ref 70–99)
Potassium: 4.4 mmol/L (ref 3.5–5.1)
Sodium: 139 mmol/L (ref 135–145)

## 2021-05-09 LAB — HEPATIC FUNCTION PANEL
ALT: 67 U/L — ABNORMAL HIGH (ref 0–44)
AST: 110 U/L — ABNORMAL HIGH (ref 15–41)
Albumin: 3.6 g/dL (ref 3.5–5.0)
Alkaline Phosphatase: 85 U/L (ref 38–126)
Bilirubin, Direct: 0.2 mg/dL (ref 0.0–0.2)
Indirect Bilirubin: 0.3 mg/dL (ref 0.3–0.9)
Total Bilirubin: 0.5 mg/dL (ref 0.3–1.2)
Total Protein: 6.8 g/dL (ref 6.5–8.1)

## 2021-05-09 LAB — CBC
HCT: 40.7 % (ref 36.0–46.0)
Hemoglobin: 12.8 g/dL (ref 12.0–15.0)
MCH: 28.7 pg (ref 26.0–34.0)
MCHC: 31.4 g/dL (ref 30.0–36.0)
MCV: 91.3 fL (ref 80.0–100.0)
Platelets: 300 10*3/uL (ref 150–400)
RBC: 4.46 MIL/uL (ref 3.87–5.11)
RDW: 13 % (ref 11.5–15.5)
WBC: 9.4 10*3/uL (ref 4.0–10.5)
nRBC: 0 % (ref 0.0–0.2)

## 2021-05-09 LAB — LIPASE, BLOOD: Lipase: 64 U/L — ABNORMAL HIGH (ref 11–51)

## 2021-05-09 LAB — I-STAT BETA HCG BLOOD, ED (MC, WL, AP ONLY): I-stat hCG, quantitative: <5 m[IU]/mL (ref <5)

## 2021-05-09 LAB — TROPONIN I (HIGH SENSITIVITY): Troponin I (High Sensitivity): 3 ng/L (ref <18)

## 2021-05-09 MED ORDER — FLUOXETINE HCL 20 MG PO CAPS: 20.0000 mg | ORAL_CAPSULE | Freq: Every day | ORAL | Status: DC

## 2021-05-09 MED ORDER — ACETAMINOPHEN 325 MG PO TABS
650.0000 mg | ORAL_TABLET | Freq: Four times a day (QID) | ORAL | Status: DC | PRN
  Administered 2021-05-09: 650 mg via ORAL
  Filled 2021-05-09: qty 2

## 2021-05-09 MED ORDER — LACTATED RINGERS IV SOLN: INTRAVENOUS | Status: DC

## 2021-05-09 MED ORDER — SIMETHICONE 80 MG PO CHEW: 80.0000 mg | CHEWABLE_TABLET | Freq: Four times a day (QID) | ORAL | Status: DC | PRN

## 2021-05-09 MED ORDER — ONDANSETRON HCL 4 MG/2ML IJ SOLN
4.0000 mg | Freq: Four times a day (QID) | INTRAMUSCULAR | Status: DC | PRN
Start: 2021-05-09 — End: 2021-05-11

## 2021-05-09 MED ORDER — OXYCODONE HCL 5 MG PO TABS: 5.0000 mg | ORAL_TABLET | ORAL | Status: DC | PRN

## 2021-05-09 MED ORDER — ACETAMINOPHEN 325 MG PO TABS
650.0000 mg | ORAL_TABLET | Freq: Four times a day (QID) | ORAL | Status: DC
Start: 2021-05-09 — End: 2021-05-11
  Administered 2021-05-09 – 2021-05-10 (×2): 650 mg via ORAL
  Filled 2021-05-09 (×2): qty 2

## 2021-05-09 MED ORDER — METHOCARBAMOL 1000 MG/10ML IJ SOLN
500.0000 mg | Freq: Four times a day (QID) | INTRAMUSCULAR | Status: DC | PRN
Start: 2021-05-09 — End: 2021-05-11

## 2021-05-09 MED ORDER — PROCHLORPERAZINE EDISYLATE 10 MG/2ML IJ SOLN: 10.0000 mg | INTRAMUSCULAR | Status: DC | PRN

## 2021-05-09 MED ORDER — ENOXAPARIN SODIUM 40 MG/0.4ML IJ SOSY
40.0000 mg | PREFILLED_SYRINGE | INTRAMUSCULAR | Status: DC
  Administered 2021-05-09: 40 mg via SUBCUTANEOUS
  Filled 2021-05-09: qty 0.4

## 2021-05-09 MED ORDER — SUCRALFATE 1 GM/10ML PO SUSP
1.0000 g | Freq: Three times a day (TID) | ORAL | Status: DC
  Administered 2021-05-09: 1 g via ORAL
  Filled 2021-05-09 (×3): qty 10

## 2021-05-09 MED ORDER — ONDANSETRON 4 MG PO TBDP
4.0000 mg | ORAL_TABLET | Freq: Once | ORAL | Status: AC
  Administered 2021-05-09: 4 mg via ORAL
  Filled 2021-05-09: qty 1

## 2021-05-09 MED ORDER — DOCUSATE SODIUM 100 MG PO CAPS
100.0000 mg | ORAL_CAPSULE | Freq: Two times a day (BID) | ORAL | Status: DC
  Administered 2021-05-09: 100 mg via ORAL
  Filled 2021-05-09: qty 1

## 2021-05-09 MED ORDER — HYDROMORPHONE HCL 1 MG/ML IJ SOLN
0.5000 mg | INTRAMUSCULAR | Status: DC | PRN
  Administered 2021-05-10: 0.5 mg via INTRAVENOUS
  Filled 2021-05-09: qty 1

## 2021-05-09 MED ORDER — IOHEXOL 350 MG/ML SOLN
80.0000 mL | Freq: Once | INTRAVENOUS | Status: AC | PRN
  Administered 2021-05-09: 80 mL via INTRAVENOUS

## 2021-05-09 MED ORDER — OXYCODONE HCL 5 MG PO TABS: 10.0000 mg | ORAL_TABLET | ORAL | Status: DC | PRN

## 2021-05-09 NOTE — ED Provider Notes (Signed)
?[facility] ?Provider Note ? ? ?CSN: [id number] ?Arrival date & time: 05/09/21  1528 ? ?  ? ?History ? ?Chief Complaint  ?Patient presents with  ? Chest Pain  ? ? ?Bonnie Baker is a 36 y.o. female. ? ? ?Chest Pain ?Associated symptoms: abdominal pain and nausea   ?Associated symptoms: no back pain and no weakness   ?Patient presents with chest and abdominal pain.  Has had for the last few days.  Is about a month post C-section.  Worse at night.  Comes in waves.  No diarrhea.  No vomiting.  Has been taking Pepcid without relief.  Does some radiation to the left side.  No known history of gallstones.  Is breast-feeding currently. ?  ? ?Home Medications ?Prior to Admission medications   ?Medication Sig Start Date End Date Taking? Authorizing Provider  ?acetaminophen (TYLENOL) 500 MG tablet Take 2 tablets (1,000 mg total) by mouth every 6 (six) hours. ?Patient taking differently: Take 1,000 mg by mouth every 6 (six) hours as needed for mild pain. 04/13/21  Yes Aloha Gell, MD  ?FLUoxetine (PROZAC) 20 MG capsule Take 1 capsule (20 mg total) by mouth daily. 04/14/21  Yes Aloha Gell, MD  ?Prenatal Vit-Fe Fumarate-FA (PRENATAL MULTIVITAMIN) TABS tablet Take 1 tablet by mouth daily at 12 noon.   Yes [provider]  ?ibuprofen (ADVIL) 800 MG tablet Take 1 tablet (800 mg total) by mouth every 8 (eight) hours as needed. ?Patient not taking: Reported on 05/09/2021 04/13/21   Aloha Gell, MD  ?oxyCODONE (OXY IR/ROXICODONE) 5 MG immediate release tablet Take 1-2 tablets (5-10 mg total) by mouth every 4 (four) hours as needed for moderate pain. ?Patient not taking: Reported on 05/09/2021 04/13/21   Aloha Gell, MD  ?   ? ?Allergies    ?Lactose   ? ?Review of Systems   ?Review of Systems  ?Constitutional:  Positive for appetite change.  ?Cardiovascular:  Positive for chest pain.  ?Gastrointestinal:  Positive for abdominal pain and nausea.  ?Genitourinary:  Negative for flank  pain.  ?Musculoskeletal:  Negative for back pain.  ?Skin:  Negative for rash.  ?Neurological:  Negative for weakness.  ? ?Physical Exam ?Updated Vital Signs ?BP (!) 127/92 (BP Location: Left Arm)   Pulse 69   Temp 97.9 ?F (36.6 ?C) (Oral)   Resp 20   Ht 5\' 9"  (1.753 m)   Wt 119.2 kg   SpO2 99%   BMI 38.81 kg/m?  ?Physical Exam ?Vitals and nursing note reviewed.  ?Abdominal:  ?   Tenderness: There is abdominal tenderness.  ?   Comments: Upper abdominal tenderness.  Epigastric to left upper quadrant.  Also right upper quadrant tenderness.  No hernia palpated.  Healing C-section wound  ?Neurological:  ?   Mental Status: She is alert.  ? ? ?ED Results / Procedures / Treatments   ?Labs ?(all labs ordered are listed, but only abnormal results are displayed) ?Labs Reviewed  ?HEPATIC FUNCTION PANEL - Abnormal; Notable for the following components:  ?    Result Value  ? AST 110 (*)   ? ALT 67 (*)   ? All other components within normal limits  ?LIPASE, BLOOD - Abnormal; Notable for the following components:  ? Lipase 64 (*)   ? All other components within normal limits  ?BASIC METABOLIC PANEL  ?CBC  ?HIV ANTIBODY (ROUTINE TESTING W REFLEX)  ?CBC  ?CREATININE, SERUM  ?BASIC METABOLIC PANEL  ?CBC  ?HEPATIC FUNCTION PANEL  ?LIPASE,  BLOOD  ?I-STAT BETA HCG BLOOD, ED ([facility], [facility], [facility] ONLY)  ?TROPONIN I (HIGH SENSITIVITY)  ? ? ?EKG ?None ? ?Radiology ?DG Chest 2 View ? ?Result Date: 05/09/2021 ?CLINICAL DATA:  Chest pain. EXAM: CHEST - 2 VIEW COMPARISON:  None. FINDINGS: The heart size and mediastinal contours are within normal limits. Both lungs are clear. The visualized skeletal structures are unremarkable. IMPRESSION: No active cardiopulmonary disease. Electronically Signed   By: Ronney Asters M.D.   On: 05/09/2021 16:43  ? ?CT ABDOMEN PELVIS W CONTRAST ? ?Result Date: 05/09/2021 ?CLINICAL DATA:  Left upper quadrant abdominal pain. Recent C-section 1 month ago. EXAM: CT ABDOMEN AND PELVIS WITH CONTRAST TECHNIQUE: Multidetector CT  imaging of the abdomen and pelvis was performed using the standard protocol following bolus administration of intravenous contrast. RADIATION DOSE REDUCTION: This exam was performed according to the departmental dose-optimization program which includes automated exposure control, adjustment of the mA and/or kV according to patient size and/or use of iterative reconstruction technique. CONTRAST:  15mL OMNIPAQUE IOHEXOL 350 MG/ML SOLN COMPARISON:  No relevant comparisons available at time dictation. FINDINGS: Lower chest: No acute abnormality. Hepatobiliary: No suspicious hepatic lesion. Periportal edema. Cholelithiasis in a distended gallbladder with pericholecystic fluid, possibly reflecting cholecystitis. No biliary ductal dilation. Pancreas: No pancreatic ductal dilation or evidence of acute inflammation. Spleen: No splenomegaly or focal splenic lesion. Adrenals/Urinary Tract: Bilateral adrenal glands appear normal. No hydronephrosis. Kidneys demonstrate symmetric enhancement. Urinary bladder is unremarkable for degree of distension. Stomach/Bowel: No enteric contrast was administered. Stomach is unremarkable for degree of distension. No pathologic dilation of small or large bowel. The appendix and terminal ileum appear normal. Moderate volume of formed stool throughout the colon. No evidence of acute bowel inflammation. Vascular/Lymphatic: Normal caliber abdominal aorta. No pathologically enlarged abdominal or pelvic lymph nodes. Reproductive: Unremarkable CT appearance of the uterus status post recent cesarean section. No suspicious adnexal mass. Other: Postsurgical change in the anterior pelvic wall related to recent C-section. No significant abdominopelvic free fluid. No walled off fluid collections. Musculoskeletal: No acute osseous abnormality. IMPRESSION: 1. Cholelithiasis in a distended gallbladder with pericholecystic fluid, possibly reflecting cholecystitis. Further evaluation with right upper quadrant  ultrasound is recommended. 2. Postsurgical change in the anterior pelvic wall related to recent cesarean section. No walled off fluid collections. 3. Moderate volume of formed stool throughout the colon. Electronically Signed   By: Dahlia Bailiff M.D.   On: 05/09/2021 17:31  ? ?US Abdomen Limited RUQ (LIVER/GB) ? ?Result Date: 05/09/2021 ?CLINICAL DATA:  Right upper quadrant pain EXAM: ULTRASOUND ABDOMEN LIMITED RIGHT UPPER QUADRANT COMPARISON:  CT abdomen 05/09/2021 FINDINGS: Gallbladder: Multiple small gallstones measuring up to 6 mm. Negative sonographic Murphy sign. Gallbladder wall non thickened. No pericholecystic fluid Common bile duct: Diameter: 4 mm Liver: No focal lesion identified. Within normal limits in parenchymal echogenicity. Portal vein is patent on color Doppler imaging with normal direction of blood flow towards the liver. Other: None. IMPRESSION: Cholelithiasis. No evidence of acute cholecystitis by ultrasound. No biliary dilatation. Electronically Signed   By: Franchot Gallo M.D.   On: 05/09/2021 20:00   ? ?Procedures ?Procedures  ? ? ?Medications Ordered in ED ?Medications  ?acetaminophen (TYLENOL) tablet 650 mg (650 mg Oral Given 05/09/21 1618)  ?sucralfate (CARAFATE) 1 GM/10ML suspension 1 g (has no administration in time range)  ?enoxaparin (LOVENOX) injection 40 mg (has no administration in time range)  ?lactated ringers infusion (has no administration in time range)  ?acetaminophen (TYLENOL) tablet 650 mg (has no administration in  time range)  ?oxyCODONE (Oxy IR/ROXICODONE) immediate release tablet 5 mg (has no administration in time range)  ?oxyCODONE (Oxy IR/ROXICODONE) immediate release tablet 10 mg (has no administration in time range)  ?HYDROmorphone (DILAUDID) injection 0.5 mg (has no administration in time range)  ?methocarbamol (ROBAXIN) 500 mg in dextrose 5 % 50 mL IVPB (has no administration in time range)  ?ondansetron (ZOFRAN) injection 4 mg (has no administration in time range)   ?prochlorperazine (COMPAZINE) injection 10 mg (has no administration in time range)  ?simethicone (MYLICON) chewable tablet 80 mg (has no administration in time range)  ?docusate sodium (COLACE) capsule 100 m

## 2021-05-09 NOTE — ED Provider Triage Note (Signed)
Emergency Medicine Provider Triage Evaluation Note ? ?Bonnie Baker , a 36 y.o. female  was evaluated in triage.  Patient is 4 weeks postpartum via C-section presents to the ED for severe left upper quadrant pain that has been ongoing for a few days.  Patient has been taking Gas-X at home without significant improvement.  Over the last 24 to 48 hours, patient notes that pain has been extremely severe, bringing her to tears.  She called her OB/GYN yesterday who recommended that she take Pepcid, but this has had no improvement.  She does note that the abdominal pain seems to radiate up into her left chest and into her back.  Pain is constant although it comes in waves of varying severity.  She endorses nausea but denies vomiting, diarrhea, and fevers. ?Review of Systems  ?Positive:  ?Negative: As above ? ?Physical Exam  ?BP 129/79 (BP Location: Left Arm)   Pulse 73   Temp 97.9 ?F (36.6 ?C) (Oral)   Resp 18   Ht 5\' 9"  (1.753 m)   Wt 119.2 kg   SpO2 100%   BMI 38.81 kg/m?  ?Gen:   Awake, no distress, hunched over clutching her stomach in obvious discomfort ?Resp:  Normal effort  ?MSK:   Moves extremities without difficulty  ?Other:  Tender to palpation in the left upper quadrant into the epigastric, otherwise soft nondistended; well-healing C-section incision ? ?Medical Decision Making  ?Medically screening exam initiated at 4:22 PM.  Appropriate orders placed.  Bonnie Baker was informed that the remainder of the evaluation will be completed by another provider, this initial triage assessment does not replace that evaluation, and the importance of remaining in the ED until their evaluation is complete. ? ? ?  ?Bonnie Baker, [place] ?05/09/21 1626 ? ?

## 2021-05-09 NOTE — ED Triage Notes (Signed)
Pt arrived via POV for right and left sharp pain that radiates to upper back.  Pt c/o epigastric pain intermittentx2 wks. Pt states it got worse this afternoon. Pt c/o nausea. Pt denies vomiting and diarrhea. Pt is tearful in triage. ?

## 2021-05-09 NOTE — H&P (Signed)
? ?Admitting Physician: Hyman Hopes Lennyn Gange ? ?Service: General Surgery ? ?CC: Abdominal pain ? ?Subjective  ? ?HPI: ?Bonnie Baker is an 36 y.o. female who is here for abdominal pain.  She is 1 month post-partum.  She gave birth to Bonnie Baker on 04/10/21, at 33 weeks 2 days, via emergency c-section due to placental abruption.  She took some ibuprofen during her recovery, but has not been taking any over the last few weeks.  For the last two weeks she has noticed waves of right upper quadrant abdominal pain.  She did not experience this pain during pregnancy or ever before.  The pain is worse at night.  It does not seem associated with foods.  It makes her break out in a sweat.  Normally it only lasts a few hours and goes away.  Today the pain was very severe, unrelenting, happening during the day and reached the point where she had to present to the emergency room. ? ?History reviewed. No pertinent past medical history. ? ?Past Surgical History:  ?Procedure Laterality Date  ? ADENOIDECTOMY    ? CESAREAN SECTION N/A 04/10/2021  ? Procedure: Repeat CESAREAN SECTION;  Surgeon: Olivia Mackie, MD;  Location: [facility];  Service: Obstetrics;  Laterality: N/A;  EDD: 05/27/21  ? SHOULDER ARTHROSCOPY    ? TYMPANOSTOMY TUBE PLACEMENT    ? ? ?Family History  ?Problem Relation Age of Onset  ? Allergic rhinitis Mother   ? Allergic rhinitis Brother   ? Allergic rhinitis Brother   ? ? ?Social:  reports that she has never smoked. She has never used smokeless tobacco. She reports that she does not currently use alcohol. She reports that she does not use drugs. ? ?Allergies: No Known Allergies ? ?Medications: ?Current Outpatient Medications  ?Medication Instructions  ? acetaminophen (TYLENOL) 1,000 mg, Oral, Every 6 hours  ? FLUoxetine (PROZAC) 20 mg, Oral, Daily  ? ibuprofen (ADVIL) 800 mg, Oral, Every 8 hours PRN  ? oxyCODONE (OXY IR/ROXICODONE) 5-10 mg, Oral, Every 4 hours PRN  ? ? ?ROS - all of the below systems have been reviewed  with the patient and positives are indicated with bold text ?General: chills, fever or night sweats ?Eyes: blurry vision or double vision ?ENT: epistaxis or sore throat ?Allergy/Immunology: itchy/watery eyes or nasal congestion ?Hematologic/Lymphatic: bleeding problems, blood clots or swollen lymph nodes ?Endocrine: temperature intolerance or unexpected weight changes ?Breast: new or changing breast lumps or nipple discharge ?Resp: cough, shortness of breath, or wheezing ?CV: chest pain or dyspnea on exertion ?GI: as per HPI ?GU: dysuria, trouble voiding, or hematuria ?MSK: joint pain or joint stiffness ?Neuro: TIA or stroke symptoms ?Derm: pruritus and skin lesion changes ?Psych: anxiety and depression ? ?Objective  ? ?PE ?Blood pressure (!) 127/92, pulse 69, temperature 97.9 ?F (36.6 ?C), temperature source Oral, resp. rate 20, height 5\' 9"  (1.753 m), weight 119.2 kg, SpO2 99 %, unknown if currently breastfeeding. ?Constitutional: NAD; conversant; no deformities ?Eyes: Moist conjunctiva; no lid lag; anicteric; PERRL ?Neck: Trachea midline; no thyromegaly ?Lungs: Normal respiratory effort; no tactile fremitus ?CV: RRR; no palpable thrills; no pitting edema ?GI: Abd Soft, severe RUQ tenderness; no palpable hepatosplenomegaly ?MSK: Normal range of motion of extremities; no clubbing/cyanosis ?Psychiatric: Appropriate affect; alert and oriented x3 ?Lymphatic: No palpable cervical or axillary lymphadenopathy ? ?Results for orders placed or performed during the hospital encounter of 05/09/21 (from the past 24 hour(s))  ?Basic metabolic panel     Status: None  ? Collection Time: 05/09/21  4:00 PM  ?Result Value Ref Range  ? Sodium 139 135 - 145 mmol/L  ? Potassium 4.4 3.5 - 5.1 mmol/L  ? Chloride 106 98 - 111 mmol/L  ? CO2 25 22 - 32 mmol/L  ? Glucose, Bld 97 70 - 99 mg/dL  ? BUN 16 6 - 20 mg/dL  ? Creatinine, Ser 0.87 0.44 - 1.00 mg/dL  ? Calcium 8.9 8.9 - 10.3 mg/dL  ? GFR, Estimated >60 >60 mL/min  ? Anion gap 8 5 - 15   ?CBC     Status: None  ? Collection Time: 05/09/21  4:00 PM  ?Result Value Ref Range  ? WBC 9.4 4.0 - 10.5 K/uL  ? RBC 4.46 3.87 - 5.11 MIL/uL  ? Hemoglobin 12.8 12.0 - 15.0 g/dL  ? HCT 40.7 36.0 - 46.0 %  ? MCV 91.3 80.0 - 100.0 fL  ? MCH 28.7 26.0 - 34.0 pg  ? MCHC 31.4 30.0 - 36.0 g/dL  ? RDW 13.0 11.5 - 15.5 %  ? Platelets 300 150 - 400 K/uL  ? nRBC 0.0 0.0 - 0.2 %  ?Troponin I (High Sensitivity)     Status: None  ? Collection Time: 05/09/21  4:00 PM  ?Result Value Ref Range  ? Troponin I (High Sensitivity) 3 <18 ng/L  ?I-Stat beta hCG blood, ED     Status: None  ? Collection Time: 05/09/21  4:10 PM  ?Result Value Ref Range  ? I-stat hCG, quantitative <5.0 <5 mIU/mL  ? Comment 3          ?Hepatic function panel     Status: Abnormal  ? Collection Time: 05/09/21  4:11 PM  ?Result Value Ref Range  ? Total Protein 6.8 6.5 - 8.1 g/dL  ? Albumin 3.6 3.5 - 5.0 g/dL  ? AST 110 (H) 15 - 41 U/L  ? ALT 67 (H) 0 - 44 U/L  ? Alkaline Phosphatase 85 38 - 126 U/L  ? Total Bilirubin 0.5 0.3 - 1.2 mg/dL  ? Bilirubin, Direct 0.2 0.0 - 0.2 mg/dL  ? Indirect Bilirubin 0.3 0.3 - 0.9 mg/dL  ?Lipase, blood     Status: Abnormal  ? Collection Time: 05/09/21  4:11 PM  ?Result Value Ref Range  ? Lipase 64 (H) 11 - 51 U/L  ? ? ? ?Imaging Orders    ?     DG Chest 2 View   No active cardiopulmonary disease. ? ?     CT ABDOMEN PELVIS W CONTRAST    ?1. Cholelithiasis in a distended gallbladder with pericholecystic ?fluid, possibly reflecting cholecystitis. Further evaluation with ?right upper quadrant ultrasound is recommended. ?2. Postsurgical change in the anterior pelvic wall related to recent ?cesarean section. No walled off fluid collections. ?3. Moderate volume of formed stool throughout the colon. ? ?     US Abdomen Limited RUQ (LIVER/GB)    ?IMPRESSION: ?Cholelithiasis. No evidence of acute cholecystitis by ultrasound. No ?biliary dilatation. ? ?Assessment and Plan  ? ?Bonnie Baker is an 36 y.o. female with RUQ abdominal pain  and gallstones concerning for cholecystitis. It is possible she is having pain from gastritis or peptic ulcer disease, however this seems less likely. I will order some Carafate to see if that improves her symptoms.   We discussed admission for inpatient vs. Outpatient cholecystectomy.  We discussed the increased clotting risk in the early postpartum period. Unfortunately, her symptoms have reached a point where she would prefer to just have her gallbladder removed as soon as  possible.  We discussed laparoscopic cholecystectomy, how the procedure is performed and its risks, benefits and alternatives.  After a full discussion and all questions answered she granted consent to proceed.  I will discuss the case with Dr. Luisa Hart who will take over the emergency general surgery service in the morning. ? ? ? ?Quentin Ore, MD ? ?[facility] ?Use [email or web address] to contact on call provider ? ?New Patient Billing: ?[phone] - High MDM ? ?

## 2021-05-09 NOTE — ED Notes (Signed)
Pt off the unit for US.

## 2021-05-10 ENCOUNTER — Observation Stay (HOSPITAL_BASED_OUTPATIENT_CLINIC_OR_DEPARTMENT_OTHER): Payer: 59 | Admitting: Anesthesiology

## 2021-05-10 ENCOUNTER — Encounter (HOSPITAL_COMMUNITY): Payer: Self-pay

## 2021-05-10 ENCOUNTER — Encounter (HOSPITAL_COMMUNITY)
Admission: EM | Disposition: A | Discharge status: Home / Self Care | Payer: Self-pay | Source: Home / Self Care | Attending: Emergency Medicine

## 2021-05-10 ENCOUNTER — Observation Stay (HOSPITAL_COMMUNITY): Payer: 59 | Admitting: Anesthesiology

## 2021-05-10 ENCOUNTER — Observation Stay (HOSPITAL_COMMUNITY): Payer: 59

## 2021-05-10 ENCOUNTER — Other Ambulatory Visit: Payer: Self-pay

## 2021-05-10 DIAGNOSIS — K8000 Calculus of gallbladder with acute cholecystitis without obstruction: Secondary | ICD-10-CM | POA: Diagnosis not present

## 2021-05-10 HISTORY — PX: CHOLECYSTECTOMY: SHX55

## 2021-05-10 LAB — BASIC METABOLIC PANEL
Anion gap: 7 (ref 5–15)
BUN: 12 mg/dL (ref 6–20)
CO2: 23 mmol/L (ref 22–32)
Calcium: 8.6 mg/dL — ABNORMAL LOW (ref 8.9–10.3)
Chloride: 104 mmol/L (ref 98–111)
Creatinine, Ser: 0.83 mg/dL (ref 0.44–1.00)
GFR, Estimated: >60 mL/min (ref >60)
Glucose, Bld: 95 mg/dL (ref 70–99)
Potassium: 4.3 mmol/L (ref 3.5–5.1)
Sodium: 134 mmol/L — ABNORMAL LOW (ref 135–145)

## 2021-05-10 LAB — CBC
HCT: 40.8 % (ref 36.0–46.0)
Hemoglobin: 12.9 g/dL (ref 12.0–15.0)
MCH: 28.4 pg (ref 26.0–34.0)
MCHC: 31.6 g/dL (ref 30.0–36.0)
MCV: 89.7 fL (ref 80.0–100.0)
Platelets: 261 10*3/uL (ref 150–400)
RBC: 4.55 MIL/uL (ref 3.87–5.11)
RDW: 13.1 % (ref 11.5–15.5)
WBC: 5.5 10*3/uL (ref 4.0–10.5)
nRBC: 0 % (ref 0.0–0.2)

## 2021-05-10 LAB — HEPATIC FUNCTION PANEL
ALT: 584 U/L — ABNORMAL HIGH (ref 0–44)
AST: 681 U/L — ABNORMAL HIGH (ref 15–41)
Albumin: 3.4 g/dL — ABNORMAL LOW (ref 3.5–5.0)
Alkaline Phosphatase: 147 U/L — ABNORMAL HIGH (ref 38–126)
Bilirubin, Direct: 1 mg/dL — ABNORMAL HIGH (ref 0.0–0.2)
Indirect Bilirubin: 0.9 mg/dL (ref 0.3–0.9)
Total Bilirubin: 1.9 mg/dL — ABNORMAL HIGH (ref 0.3–1.2)
Total Protein: 6.9 g/dL (ref 6.5–8.1)

## 2021-05-10 LAB — LIPASE, BLOOD: Lipase: 54 U/L — ABNORMAL HIGH (ref 11–51)

## 2021-05-10 SURGERY — LAPAROSCOPIC CHOLECYSTECTOMY WITH INTRAOPERATIVE CHOLANGIOGRAM
Anesthesia: General | Site: Abdomen

## 2021-05-10 MED ORDER — SUGAMMADEX SODIUM 200 MG/2ML IV SOLN
INTRAVENOUS | Status: DC | PRN
  Administered 2021-05-10: 200 mg via INTRAVENOUS

## 2021-05-10 MED ORDER — PROPOFOL 10 MG/ML IV BOLUS
INTRAVENOUS | Status: DC | PRN
  Administered 2021-05-10: 200 mg via INTRAVENOUS

## 2021-05-10 MED ORDER — SODIUM CHLORIDE 0.9 % IV SOLN
INTRAVENOUS | Status: DC | PRN
  Administered 2021-05-10: 20 mL

## 2021-05-10 MED ORDER — ONDANSETRON HCL 4 MG/2ML IJ SOLN
INTRAMUSCULAR | Status: DC | PRN
  Administered 2021-05-10: 4 mg via INTRAVENOUS

## 2021-05-10 MED ORDER — SODIUM CHLORIDE 0.9 % IR SOLN
Status: DC | PRN
  Administered 2021-05-10: 1000 mL

## 2021-05-10 MED ORDER — SCOPOLAMINE 1 MG/3DAYS TD PT72
1.0000 | MEDICATED_PATCH | TRANSDERMAL | Status: DC
  Filled 2021-05-10: qty 1

## 2021-05-10 MED ORDER — OXYCODONE HCL 5 MG PO TABS
ORAL_TABLET | ORAL | Status: AC
  Filled 2021-05-10: qty 1

## 2021-05-10 MED ORDER — CHLORHEXIDINE GLUCONATE 0.12 % MT SOLN
15.0000 mL | Freq: Once | OROMUCOSAL | Status: AC
  Administered 2021-05-10: 15 mL via OROMUCOSAL

## 2021-05-10 MED ORDER — BUPIVACAINE-EPINEPHRINE (PF) 0.25% -1:200000 IJ SOLN
INTRAMUSCULAR | Status: AC
  Filled 2021-05-10: qty 30

## 2021-05-10 MED ORDER — HEMOSTATIC AGENTS (NO CHARGE) OPTIME
TOPICAL | Status: DC | PRN
  Administered 2021-05-10: 1 via TOPICAL

## 2021-05-10 MED ORDER — PROPOFOL 10 MG/ML IV BOLUS
INTRAVENOUS | Status: AC
  Filled 2021-05-10: qty 20

## 2021-05-10 MED ORDER — DEXMEDETOMIDINE (PRECEDEX) IN NS 20 MCG/5ML (4 MCG/ML) IV SYRINGE
PREFILLED_SYRINGE | INTRAVENOUS | Status: DC | PRN
  Administered 2021-05-10: 8 ug via INTRAVENOUS

## 2021-05-10 MED ORDER — MIDAZOLAM HCL 2 MG/2ML IJ SOLN
INTRAMUSCULAR | Status: DC | PRN
  Administered 2021-05-10: 2 mg via INTRAVENOUS

## 2021-05-10 MED ORDER — LACTATED RINGERS IV SOLN: INTRAVENOUS | Status: DC

## 2021-05-10 MED ORDER — DEXAMETHASONE SODIUM PHOSPHATE 10 MG/ML IJ SOLN
INTRAMUSCULAR | Status: DC | PRN
  Administered 2021-05-10: 5 mg via INTRAVENOUS

## 2021-05-10 MED ORDER — LIDOCAINE 2% (20 MG/ML) 5 ML SYRINGE
INTRAMUSCULAR | Status: DC | PRN
  Administered 2021-05-10: 100 mg via INTRAVENOUS

## 2021-05-10 MED ORDER — OXYCODONE HCL 5 MG PO TABS
5.0000 mg | ORAL_TABLET | Freq: Once | ORAL | Status: AC
  Administered 2021-05-10: 5 mg via ORAL

## 2021-05-10 MED ORDER — BUPIVACAINE-EPINEPHRINE 0.25% -1:200000 IJ SOLN
INTRAMUSCULAR | Status: DC | PRN
  Administered 2021-05-10: 8 mL

## 2021-05-10 MED ORDER — HYDROMORPHONE HCL 1 MG/ML IJ SOLN
INTRAMUSCULAR | Status: DC | PRN
  Administered 2021-05-10: .5 mg via INTRAVENOUS

## 2021-05-10 MED ORDER — FENTANYL CITRATE (PF) 250 MCG/5ML IJ SOLN
INTRAMUSCULAR | Status: AC
Start: 2021-05-10 — End: ?
  Filled 2021-05-10: qty 5

## 2021-05-10 MED ORDER — OXYCODONE HCL 5 MG PO TABS: 5.0000 mg | ORAL_TABLET | Freq: Four times a day (QID) | ORAL | 0 refills | Status: AC | PRN

## 2021-05-10 MED ORDER — FENTANYL CITRATE (PF) 100 MCG/2ML IJ SOLN
INTRAMUSCULAR | Status: AC
  Filled 2021-05-10: qty 2

## 2021-05-10 MED ORDER — HYDROMORPHONE HCL 1 MG/ML IJ SOLN
INTRAMUSCULAR | Status: AC
  Filled 2021-05-10: qty 0.5

## 2021-05-10 MED ORDER — 0.9 % SODIUM CHLORIDE (POUR BTL) OPTIME
TOPICAL | Status: DC | PRN
  Administered 2021-05-10: 1000 mL

## 2021-05-10 MED ORDER — FENTANYL CITRATE (PF) 100 MCG/2ML IJ SOLN
25.0000 ug | INTRAMUSCULAR | Status: DC | PRN
  Administered 2021-05-10 (×3): 50 ug via INTRAVENOUS

## 2021-05-10 MED ORDER — CEFAZOLIN SODIUM-DEXTROSE 2-4 GM/100ML-% IV SOLN
2.0000 g | Freq: Once | INTRAVENOUS | Status: AC
  Administered 2021-05-10: 2 g via INTRAVENOUS

## 2021-05-10 MED ORDER — CEFAZOLIN SODIUM-DEXTROSE 2-4 GM/100ML-% IV SOLN
INTRAVENOUS | Status: AC
  Filled 2021-05-10: qty 100

## 2021-05-10 MED ORDER — FENTANYL CITRATE (PF) 250 MCG/5ML IJ SOLN
INTRAMUSCULAR | Status: DC | PRN
  Administered 2021-05-10: 150 ug via INTRAVENOUS
  Administered 2021-05-10: 50 ug via INTRAVENOUS

## 2021-05-10 MED ORDER — ORAL CARE MOUTH RINSE: 15.0000 mL | Freq: Once | OROMUCOSAL | Status: AC

## 2021-05-10 MED ORDER — ROCURONIUM BROMIDE 10 MG/ML (PF) SYRINGE
PREFILLED_SYRINGE | INTRAVENOUS | Status: DC | PRN
  Administered 2021-05-10: 50 mg via INTRAVENOUS

## 2021-05-10 MED ORDER — MIDAZOLAM HCL 2 MG/2ML IJ SOLN
INTRAMUSCULAR | Status: AC
  Filled 2021-05-10: qty 2

## 2021-05-10 MED ORDER — SUCCINYLCHOLINE CHLORIDE 200 MG/10ML IV SOSY
PREFILLED_SYRINGE | INTRAVENOUS | Status: DC | PRN
Start: 2021-05-10 — End: 2021-05-10
  Administered 2021-05-10: 160 mg via INTRAVENOUS

## 2021-05-10 SURGICAL SUPPLY — 45 items
APPLICATOR ARISTA FLEXITIP XL (MISCELLANEOUS) ×1 IMPLANT
APPLIER CLIP ROT 10 11.4 M/L (STAPLE) ×2
BAG COUNTER SPONGE SURGICOUNT (BAG) ×2 IMPLANT
BLADE CLIPPER SURG (BLADE) IMPLANT
CANISTER SUCT 3000ML PPV (MISCELLANEOUS) ×2 IMPLANT
CHLORAPREP W/TINT 26 (MISCELLANEOUS) ×2 IMPLANT
CLIP APPLIE ROT 10 11.4 M/L (STAPLE) ×1 IMPLANT
COVER MAYO STAND STRL (DRAPES) ×2 IMPLANT
COVER SURGICAL LIGHT HANDLE (MISCELLANEOUS) ×2 IMPLANT
DERMABOND ADVANCED (GAUZE/BANDAGES/DRESSINGS) ×1
DERMABOND ADVANCED .7 DNX12 (GAUZE/BANDAGES/DRESSINGS) ×1 IMPLANT
DRAPE C-ARM 42X120 X-RAY (DRAPES) ×2 IMPLANT
ELECT REM PT RETURN 9FT ADLT (ELECTROSURGICAL) ×2
ELECTRODE REM PT RTRN 9FT ADLT (ELECTROSURGICAL) ×1 IMPLANT
GLOVE SRG 8 PF TXTR STRL LF DI (GLOVE) ×1 IMPLANT
GLOVE SURG ENC MOIS LTX SZ8 (GLOVE) ×2 IMPLANT
GLOVE SURG UNDER POLY LF SZ8 (GLOVE) ×1
GOWN STRL REUS W/ TWL LRG LVL3 (GOWN DISPOSABLE) ×2 IMPLANT
GOWN STRL REUS W/ TWL XL LVL3 (GOWN DISPOSABLE) ×1 IMPLANT
GOWN STRL REUS W/TWL LRG LVL3 (GOWN DISPOSABLE) ×2
GOWN STRL REUS W/TWL XL LVL3 (GOWN DISPOSABLE) ×1
HEMOSTAT ARISTA ABSORB 3G PWDR (HEMOSTASIS) ×1 IMPLANT
KIT BASIN OR (CUSTOM PROCEDURE TRAY) ×2 IMPLANT
KIT TURNOVER KIT B (KITS) ×2 IMPLANT
NS IRRIG 1000ML POUR BTL (IV SOLUTION) ×2 IMPLANT
PAD ARMBOARD 7.5X6 YLW CONV (MISCELLANEOUS) ×2 IMPLANT
POUCH RETRIEVAL ECOSAC 10 (ENDOMECHANICALS) ×1 IMPLANT
POUCH RETRIEVAL ECOSAC 10MM (ENDOMECHANICALS) ×1
SCISSORS LAP 5X35 DISP (ENDOMECHANICALS) ×2 IMPLANT
SET CHOLANGIOGRAPH 5 50 .035 (SET/KITS/TRAYS/PACK) ×2 IMPLANT
SET IRRIG TUBING LAPAROSCOPIC (IRRIGATION / IRRIGATOR) ×2 IMPLANT
SET TUBE SMOKE EVAC HIGH FLOW (TUBING) ×2 IMPLANT
SLEEVE ENDOPATH XCEL 5M (ENDOMECHANICALS) ×2 IMPLANT
SPECIMEN JAR SMALL (MISCELLANEOUS) ×2 IMPLANT
SUT MNCRL AB 4-0 PS2 18 (SUTURE) ×2 IMPLANT
SYS KII FIOS ACCESS ABD 5X100 (TROCAR) ×2
SYSTEM KII FIOS ACES ABD 5X100 (TROCAR) IMPLANT
TOWEL GREEN STERILE (TOWEL DISPOSABLE) ×2 IMPLANT
TOWEL GREEN STERILE FF (TOWEL DISPOSABLE) ×2 IMPLANT
TRAY LAPAROSCOPIC MC (CUSTOM PROCEDURE TRAY) ×2 IMPLANT
TROCAR BALLN 12MMX100 BLUNT (TROCAR) ×1 IMPLANT
TROCAR Z-THREAD FIOS 11X100 BL (TROCAR) ×1 IMPLANT
TROCAR Z-THREAD FIOS 5X100MM (TROCAR) ×1 IMPLANT
WARMER LAPAROSCOPE (MISCELLANEOUS) ×2 IMPLANT
WATER STERILE IRR 1000ML POUR (IV SOLUTION) ×2 IMPLANT

## 2021-05-10 NOTE — ED Notes (Signed)
Pt is concerned about her milk supply decreasing d/ t not having fluids. RN will notify MD for any suggestion ?

## 2021-05-10 NOTE — Interval H&P Note (Signed)
History and Physical Interval Note: ? ?05/10/2021 ?3:48 PM ? ?Bonnie Baker  has presented today for surgery, with the diagnosis of acute cholecystitis.  The various methods of treatment have been discussed with the patient and family. After consideration of risks, benefits and other options for treatment, the patient has consented to  Procedure(s): ?LAPAROSCOPIC CHOLECYSTECTOMY WITH INTRAOPERATIVE CHOLANGIOGRAM (N/A) as a surgical intervention.  The patient's history has been reviewed, patient examined, no change in status, stable for surgery.  I have reviewed the patient's chart and labs.  Questions were answered to the patient's satisfaction.   ? ? ?Bonnie Baker ? ? ?

## 2021-05-10 NOTE — ED Notes (Signed)
Provided pt with milk storage bottles at bedside.  ?

## 2021-05-10 NOTE — Op Note (Signed)
Laparoscopic Cholecystectomy with IOC Procedure Note ? ?Indications: This patient presents with symptomatic gallbladder disease and will undergo laparoscopic cholecystectomy. The procedure has been discussed with the patient. Operative and non operative treatments have been discussed. Risks of surgery include bleeding, infection,  Common bile duct injury,  Injury to the stomach,liver, colon,small intestine, abdominal wall,  Diaphragm,  Major blood vessels,  And the need for an open procedure.  Other risks include worsening of medical problems, death,  DVT and pulmonary embolism, and cardiovascular events.   Medical options have also been discussed. The patient has been informed of long term expectations of surgery and non surgical options,  The patient agrees to proceed.    ? ?Pre-operative Diagnosis: Calculus of gallbladder with acute cholecystitis, without mention of obstruction ? ?Post-operative Diagnosis: Same ? ?Surgeon: Dortha Schwalbe  MD  ? ?Assistants: NONE ? ?Anesthesia: General endotracheal anesthesia and Local anesthesia 0.25.% bupivacaine ? ?ASA Class: 1 ? ?Procedure Details  ?The patient was seen again in the Holding Room. The risks, benefits, complications, treatment options, and expected outcomes were discussed with the patient. The possibilities of reaction to medication, pulmonary aspiration, perforation of viscus, bleeding, recurrent infection, finding a normal gallbladder, the need for additional procedures, failure to diagnose a condition, the possible need to convert to an open procedure, and creating a complication requiring transfusion or operation were discussed with the patient. The patient and/or family concurred with the proposed plan, giving informed consent. The site of surgery properly noted/marked. The patient was taken to Operating Room, identified as [facility] and the procedure verified as Laparoscopic Cholecystectomy with Intraoperative Cholangiograms. A Time Out was held  and the above information confirmed. ? ?Prior to the induction of general anesthesia, antibiotic prophylaxis was administered. General endotracheal anesthesia was then administered and tolerated well. After the induction, the abdomen was prepped in the usual sterile fashion. The patient was positioned in the supine position with the left arm comfortably tucked, along with some reverse Trendelenburg. ? ?Local anesthetic agent was injected into the skin near the umbilicus and an incision made. The midline fascia was incised and the Hasson technique was used to introduce a 12 mm port under direct vision. It was secured with a figure of eight Vicryl suture placed in the usual fashion. Pneumoperitoneum was then created with CO2 and tolerated well without any adverse changes in the patient's vital signs. Additional trocars were introduced under direct vision with an 11 mm trocar in the epigastrium and 2 5 mm trocars in the right upper quadrant. All skin incisions were infiltrated with a local anesthetic agent before making the incision and placing the trocars.  ? ?The gallbladder was identified, the fundus grasped and retracted cephalad. Adhesions were lysed bluntly and with the electrocautery where indicated, taking care not to injure any adjacent organs or viscus. The infundibulum was grasped and retracted laterally, exposing the peritoneum overlying the triangle of Calot. This was then divided and exposed in a blunt fashion. The cystic duct was clearly identified and bluntly dissected circumferentially. The junctions of the gallbladder, cystic duct and common bile duct were clearly identified prior to the division of any linear structure.  ? ?An incision was made in the cystic duct and the cholangiogram catheter introduced. The catheter was secured using an endoclip. The study showed no stones and good visualization of the distal and proximal biliary tree. The catheter was then removed.  ? ?The cystic duct was then   ligated with surgical clips  on the patient  side and  clipped on the gallbladder side and divided. The cystic artery was identified, dissected free, ligated with clips and divided as well. Posterior cystic artery clipped and divided. ? ?The gallbladder was dissected from the liver bed in retrograde fashion with the electrocautery. The gallbladder was removed. The liver bed was irrigated and inspected. Hemostasis was achieved with the electrocautery. Copious irrigation was utilized and was repeatedly aspirated until clear all particulate matter. Hemostasis was achieved with no signs  Of bleeding or bile leakage. ? ?Pneumoperitoneum was completely reduced after viewing removal of the trocars under direct vision. The wound was thoroughly irrigated and the fascia was then closed with a figure of eight suture; the skin was then closed with 4 O monocryl  and a sterile dressing of dermabond  was applied. ? ?Instrument, sponge, and needle counts were correct at closure and at the conclusion of the case.  ? ?Findings: ?Cholecystitis with Cholelithiasis ? ?Estimated Blood Loss: less than 50 mL ?        ?Drains: none  ?        ?Total IV Fluids: per record ?        ?Specimens: Gallbladder     ?      ?Complications: None; patient tolerated the procedure well. ?        ?Disposition: PACU - hemodynamically stable. ?        ?Condition: stable ?  ?

## 2021-05-10 NOTE — Anesthesia Postprocedure Evaluation (Signed)
Anesthesia Post Note ? ?Patient: Bonnie Baker ? ?Procedure(s) Performed: LAPAROSCOPIC CHOLECYSTECTOMY WITH INTRAOPERATIVE CHOLANGIOGRAM (Abdomen) ? ?  ? ?Patient location during evaluation: PACU ?Anesthesia Type: General ?Level of consciousness: awake ?Pain management: pain level controlled ?Respiratory status: spontaneous breathing ?Cardiovascular status: stable ?Postop Assessment: no apparent nausea or vomiting ?Anesthetic complications: no ? ? ?No notable events documented. ? ?Last Vitals:  ?Vitals:  ? 05/10/21 1738 05/10/21 1753  ?BP: (!) 152/95 (!) 143/98  ?Pulse: 69 72  ?Resp: 14 14  ?Temp: 36.6 ?C   ?SpO2: 96% 96%  ?  ?Last Pain:  ?Vitals:  ? 05/10/21 1536  ?TempSrc:   ?PainSc: 6   ? ? ?  ?  ?  ?  ?  ?  ? ?Maryetta Shafer ? ? ? ? ?

## 2021-05-10 NOTE — Anesthesia Preprocedure Evaluation (Addendum)
Anesthesia Evaluation  ?Patient identified by MRN, date of birth, ID band ?Patient awake ? ? ? ?Reviewed: ?Allergy & Precautions, Patient's Chart, lab work & pertinent test results ? ?Airway ?Mallampati: II ? ?TM Distance: >3 FB ? ? ? ? Dental ?  ?Pulmonary ?neg pulmonary ROS,  ?  ?breath sounds clear to auscultation ? ? ? ? ? ? Cardiovascular ?negative cardio ROS ? ? ?Rhythm:Regular Rate:Normal ? ? ?  ?Neuro/Psych ? Headaches, PSYCHIATRIC DISORDERS   ? GI/Hepatic ?negative GI ROS, Neg liver ROS,   ?Endo/Other  ?negative endocrine ROS ? Renal/GU ?negative Renal ROS  ? ?  ?Musculoskeletal ?negative musculoskeletal ROS ?(+)  ? Abdominal ?(+) - obese,   ?Peds ? Hematology ?negative hematology ROS ?(+)   ?Anesthesia Other Findings ?acute cholecystitis ? Reproductive/Obstetrics ? ?  ? ? ? ? ? ? ? ? ? ? ? ? ? ?  ?  ? ? ? ? ? ? ? ?Anesthesia Physical ?Anesthesia Plan ? ?ASA: 2 and emergent ? ?Anesthesia Plan: General  ? ?Post-op Pain Management:   ? ?Induction: Intravenous ? ?PONV Risk Score and Plan: 4 or greater and Ondansetron, Dexamethasone, Midazolam, Scopolamine patch - Pre-op and Treatment may vary due to age or medical condition ? ?Airway Management Planned: Oral ETT ? ?Additional Equipment:  ? ?Intra-op Plan:  ? ?Post-operative Plan: Extubation in OR ? ?Informed Consent: I have reviewed the patients History and Physical, chart, labs and discussed the procedure including the risks, benefits and alternatives for the proposed anesthesia with the patient or authorized representative who has indicated his/her understanding and acceptance.  ? ? ? ?Dental advisory given ? ?Plan Discussed with: CRNA and Anesthesiologist ? ?Anesthesia Plan Comments:   ? ? ? ? ? ?Anesthesia Quick Evaluation ? ?

## 2021-05-10 NOTE — Anesthesia Procedure Notes (Signed)
Procedure Name: Intubation ?Date/Time: 05/10/2021 4:18 PM ?Performed by: Lynnell Chad, CRNA ?Pre-anesthesia Checklist: Patient identified, Emergency Drugs available, Suction available and Patient being monitored ?Patient Re-evaluated:Patient Re-evaluated prior to induction ?Oxygen Delivery Method: Circle System Utilized ?Preoxygenation: Pre-oxygenation with 100% oxygen ?Induction Type: IV induction ?Ventilation: Mask ventilation without difficulty ?Laryngoscope Size: Hyacinth Meeker and 2 ?Grade View: Grade I ?Tube type: Oral ?Tube size: 7.0 mm ?Number of attempts: 1 ?Airway Equipment and Method: Stylet and Oral airway ?Placement Confirmation: ETT inserted through vocal cords under direct vision, positive ETCO2 and breath sounds checked- equal and bilateral ?Secured at: 21 cm ?Tube secured with: Tape ?Dental Injury: Teeth and Oropharynx as per pre-operative assessment  ? ? ? ? ?

## 2021-05-10 NOTE — Transfer of Care (Signed)
Immediate Anesthesia Transfer of Care Note ? ?Patient: Bonnie Baker ? ?Procedure(s) Performed: LAPAROSCOPIC CHOLECYSTECTOMY WITH INTRAOPERATIVE CHOLANGIOGRAM (Abdomen) ? ?Patient Location: PACU ? ?Anesthesia Type:General ? ?Level of Consciousness: drowsy ? ?Airway & Oxygen Therapy: Patient Spontanous Breathing ? ?Post-op Assessment: Report given to RN and Post -op Vital signs reviewed and stable ? ?Post vital signs: Reviewed and stable ? ?Last Vitals:  ?Vitals Value Taken Time  ?BP 152/95 05/10/21 1738  ?Temp    ?Pulse 66 05/10/21 1739  ?Resp 11 05/10/21 1739  ?SpO2 95 % 05/10/21 1739  ?Vitals shown include unvalidated device data. ? ?Last Pain:  ?Vitals:  ? 05/10/21 1536  ?TempSrc:   ?PainSc: 6   ?   ? ?Patients Stated Pain Goal: 3 (05/10/21 1536) ? ?Complications: No notable events documented. ?

## 2021-05-10 NOTE — Discharge Instructions (Signed)
[facility] ______[facility] LAPAROSCOPIC SURGERY: POST OP INSTRUCTIONS Always review your discharge instruction sheet given to you by the facility where your surgery was performed. IF YOU HAVE DISABILITY OR FAMILY LEAVE FORMS, YOU MUST BRING THEM TO THE OFFICE FOR PROCESSING.   DO NOT GIVE THEM TO YOUR DOCTOR.  A prescription for pain medication may be given to you upon discharge.  Take your pain medication as prescribed, if needed.  If narcotic pain medicine is not needed, then you may take acetaminophen (Tylenol) or ibuprofen (Advil) as needed. Take your usually prescribed medications unless otherwise directed. If you need a refill on your pain medication, please contact your pharmacy.  They will contact our office to request authorization. Prescriptions will not be filled after 5pm or on week-ends. You should follow a light diet the first few days after arrival home, such as soup and crackers, etc.  Be sure to include lots of fluids daily. Most patients will experience some swelling and bruising in the area of the incisions.  Ice packs will help.  Swelling and bruising can take several days to resolve.  It is common to experience some constipation if taking pain medication after surgery.  Increasing fluid intake and taking a stool softener (such as Colace) will usually help or prevent this problem from occurring.  A mild laxative (Milk of Magnesia or Miralax) should be taken according to package instructions if there are no bowel movements after 48 hours. Unless discharge instructions indicate otherwise, you may remove your bandages 24-48 hours after surgery, and you may shower at that time.  You may have steri-strips (small skin tapes) in place directly over the incision.  These strips should be left on the skin for 7-10 days.  If your surgeon used skin glue on the incision, you may shower in 24 hours.  The glue will flake off over the next 2-3 weeks.  Any sutures or staples will be  removed at the office during your follow-up visit. ACTIVITIES:  You may resume regular (light) daily activities beginning the next day--such as daily self-care, walking, climbing stairs--gradually increasing activities as tolerated.  You may have sexual intercourse when it is comfortable.  Refrain from any heavy lifting or straining until approved by your doctor. You may drive when you are no longer taking prescription pain medication, you can comfortably wear a seatbelt, and you can safely maneuver your car and apply brakes. RETURN TO WORK:  __________________________________________________________ You should see your doctor in the office for a follow-up appointment approximately 2-3 weeks after your surgery.  Make sure that you call for this appointment within a day or two after you arrive home to insure a convenient appointment time. OTHER INSTRUCTIONS: __________________________________________________________________________________________________________________________ __________________________________________________________________________________________________________________________ WHEN TO CALL YOUR DOCTOR: Fever over 101.0 Inability to urinate Continued bleeding from incision. Increased pain, redness, or drainage from the incision. Increasing abdominal pain  The clinic staff is available to answer your questions during regular business hours.  Please don't hesitate to call and ask to speak to one of the nurses for clinical concerns.  If you have a medical emergency, go to the nearest emergency room or call 911.  A surgeon from [facility] is always on call at the hospital. [address], [address], [place], [place]  [address] ? [address] [address], [place], [place]   [address] [phone] ? [phone] ? FAX [phone] Web site: [email or web address]  

## 2021-05-11 ENCOUNTER — Encounter (HOSPITAL_COMMUNITY): Payer: Self-pay | Admitting: Surgery

## 2021-05-11 NOTE — Discharge Summary (Signed)
Physician Discharge Summary  ?Patient ID: ?Bonnie Baker ?MRN: [id number] ?DOB/AGE: Jul 13, 1985 36 y.o. ? ?Admit date: 05/09/2021 ?Discharge date: 05/11/2021 ? ?Admission Diagnoses:cholecystitis  ? ?Discharge Diagnoses:  ?Principal Problem: ?  Cholecystitis ? ? ?Discharged Condition: good ? ?Hospital Course: PT DISCHARGED FROM RECOVERY ?NEVER ADMITTED  ? ?  ? ? ? ?Treatments: surgery: LAPAROSCOPIC CHOLECYSTECTOMY  ? ?Discharge Exam: ?Blood pressure 129/89, pulse 68, temperature (!) 97.4 ?F (36.3 ?C), resp. rate 12, height 5\' 9"  (1.753 m), weight 104.3 kg, SpO2 95 %, currently breastfeeding. ?Incision/Wound:cdi  ? ?Disposition: Discharge disposition: 01-Home or Self Care ? ? ? ? ? ? ?Discharge Instructions   ? ? Diet - low sodium heart healthy   Complete by: As directed ?  ? Increase activity slowly   Complete by: As directed ?  ? ?  ? ?Allergies as of 05/10/2021   ? ?   Reactions  ? Lactose   ? unknown  ? ?  ? ?  ?Medication List  ?  ? ?TAKE these medications   ? ?acetaminophen 500 MG tablet ?Commonly known as: TYLENOL ?Take 2 tablets (1,000 mg total) by mouth every 6 (six) hours. ?What changed:  ?when to take this ?reasons to take this ?  ?FLUoxetine 20 MG capsule ?Commonly known as: PROZAC ?Take 1 capsule (20 mg total) by mouth daily. ?  ?ibuprofen 800 MG tablet ?Commonly known as: ADVIL ?Take 1 tablet (800 mg total) by mouth every 8 (eight) hours as needed. ?  ?oxyCODONE 5 MG immediate release tablet ?Commonly known as: Oxy IR/ROXICODONE ?Take 1-2 tablets (5-10 mg total) by mouth every 4 (four) hours as needed for moderate pain. ?What changed: Another medication with the same name was added. Make sure you understand how and when to take each. ?  ?oxyCODONE 5 MG immediate release tablet ?Commonly known as: Oxy IR/ROXICODONE ?Take 1 tablet (5 mg total) by mouth every 6 (six) hours as needed for severe pain. ?What changed: You were already taking a medication with the same name, and this prescription was added. Make sure  you understand how and when to take each. ?  ?prenatal multivitamin Tabs tablet ?Take 1 tablet by mouth daily at 12 noon. ?  ? ?  ? ? ? ?Signed: ?07/10/2021 Yuval Rubens ?05/11/2021, 1:29 PM ? ? ?

## 2021-05-12 LAB — SURGICAL PATHOLOGY

## 2021-05-27 ENCOUNTER — Inpatient Hospital Stay (HOSPITAL_COMMUNITY): Admission: AD | Admit: 2021-05-27 | Payer: 59 | Source: Home / Self Care | Admitting: Obstetrics and Gynecology

## 2021-08-05 IMAGING — CT CT MAXILLOFACIAL W/O CM
1 series · 15 of 30 positions shown, 19 images · non-contrast
Comparison: None.

EXAM:
CT MAXILLOFACIAL WITHOUT CONTRAST
TECHNIQUE: Multidetector CT images of the paranasal sinuses were obtained using
the standard protocol without intravenous contrast.

[Series 6: soft tissue · axial · 0.39mm/px · z∈[+790,+910]mm · 15 of 130 slices shown, 19 images]
[im 5/130  brain]
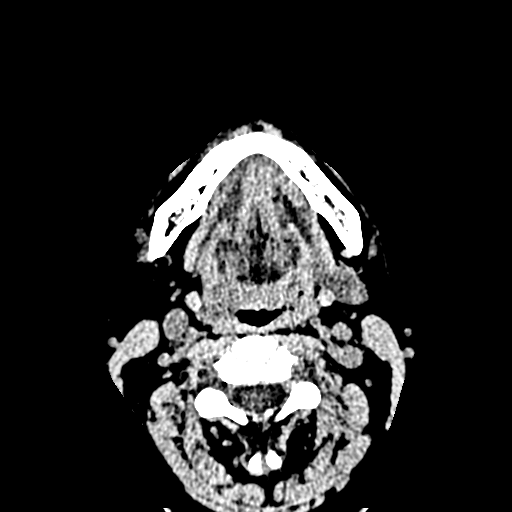
[im 5/130  bone]
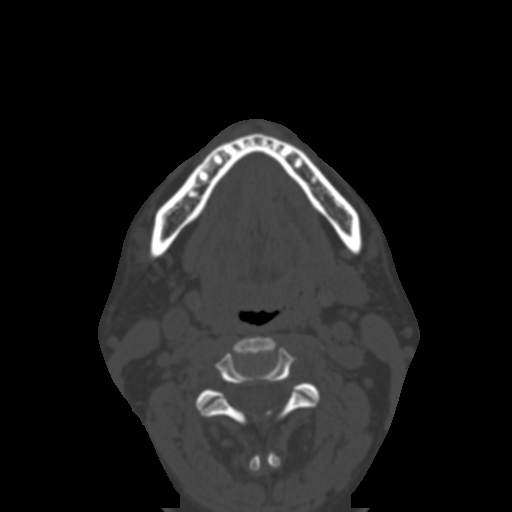
[im 14/130  bone]
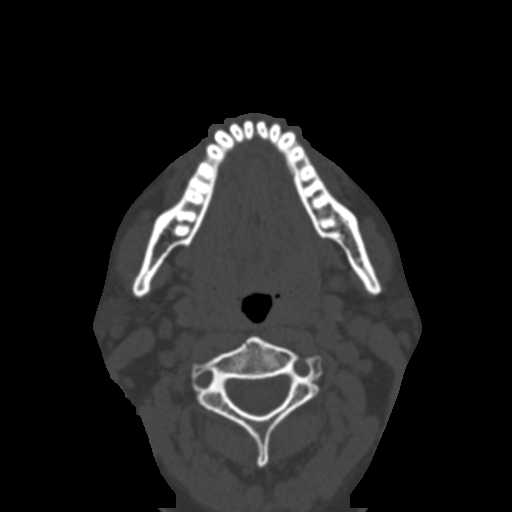
[im 23/130  bone]
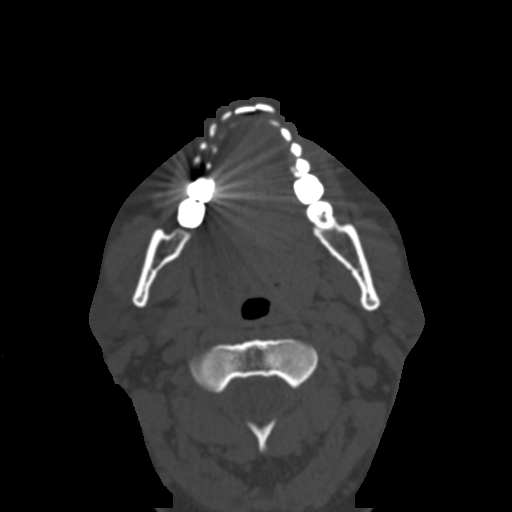
[im 36/130  bone]
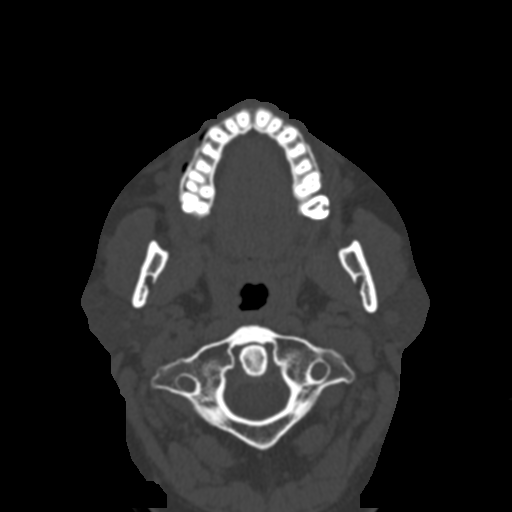
[im 45/130  brain]
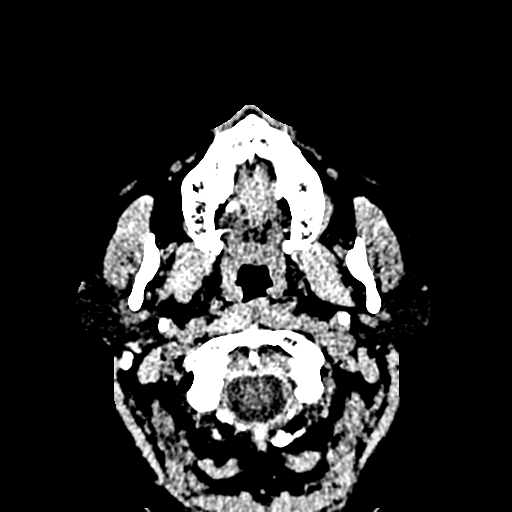
[im 45/130  bone]
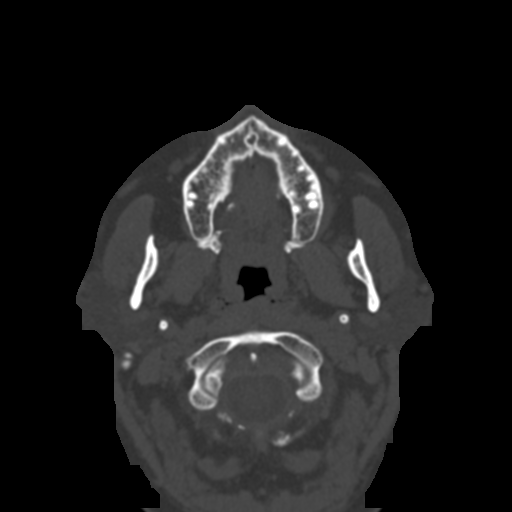
[im 49/130  bone]
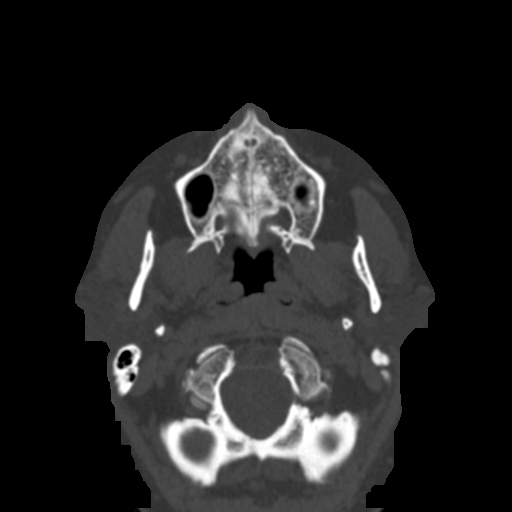
[im 58/130  bone]
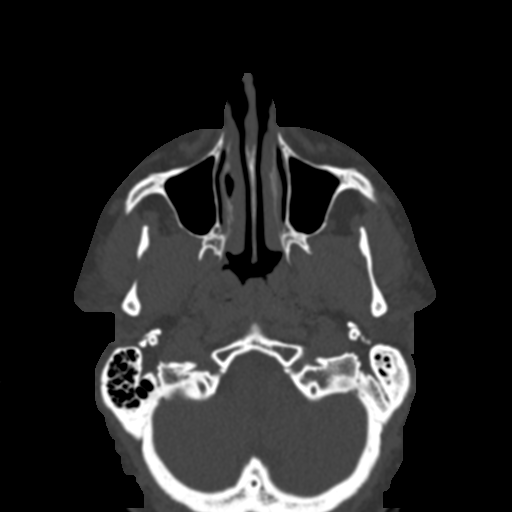
[im 67/130  bone]
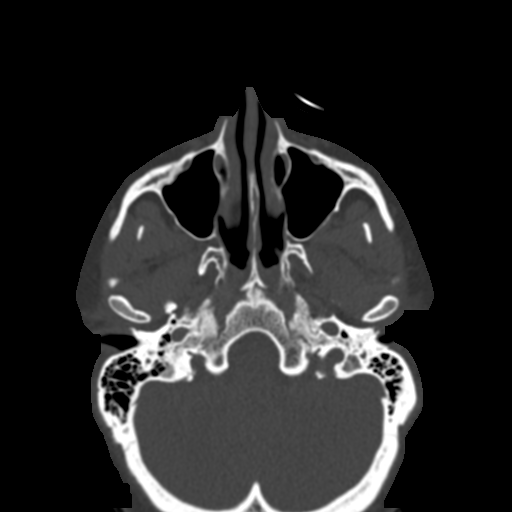
[im 76/130  brain]
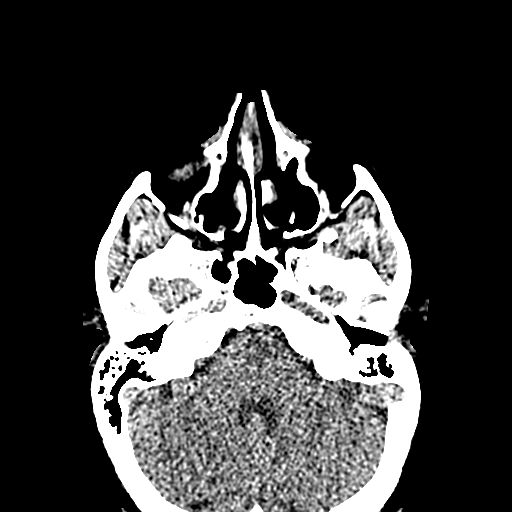
[im 76/130  bone]
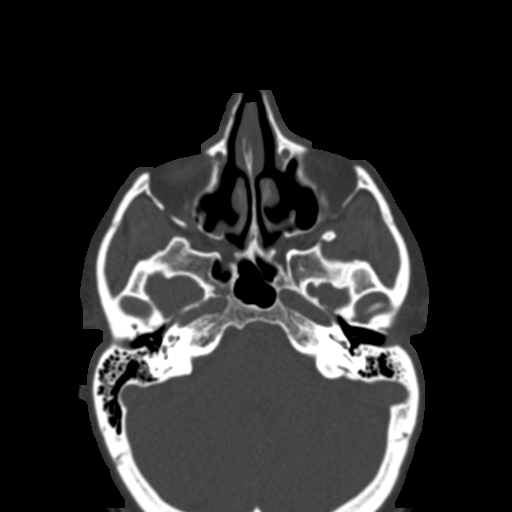
[im 85/130  bone]
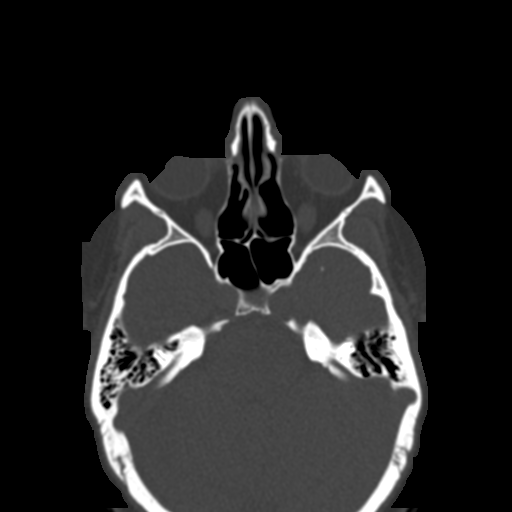
[im 89/130  bone]
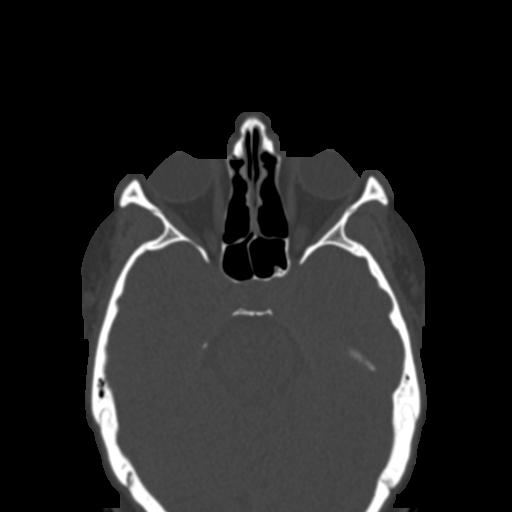
[im 98/130  bone]
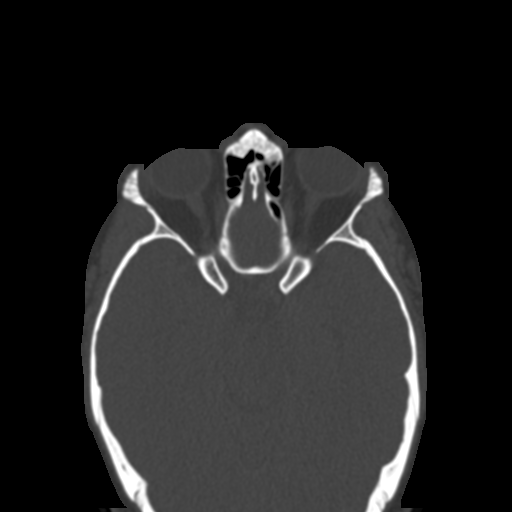
[im 107/130  brain]
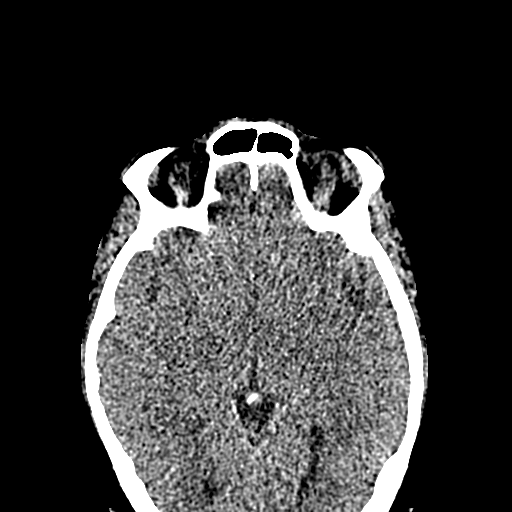
[im 107/130  bone]
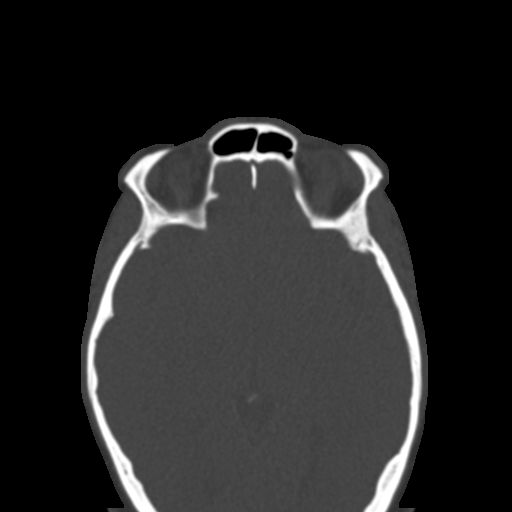
[im 116/130  bone]
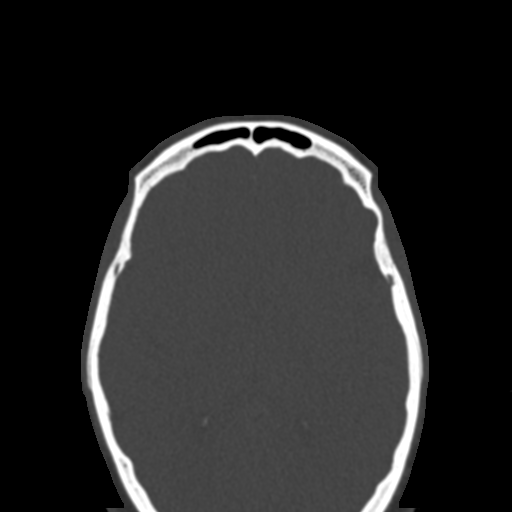
[im 125/130  bone]
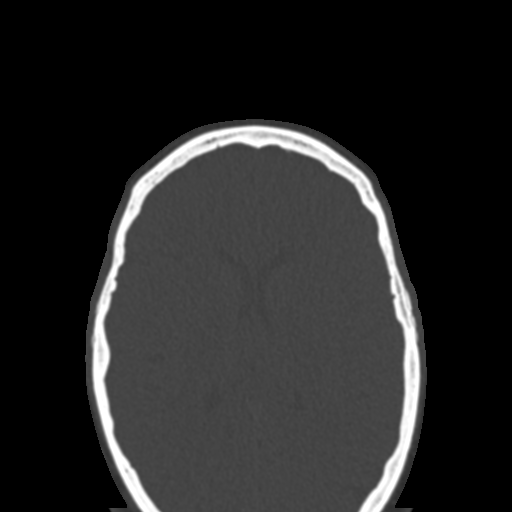

[15 of 30 positions shown; findings below may reference images not displayed]

FINDINGS: Paranasal sinuses:

Frontal: Normally aerated. Patent frontal sinus drainage pathways.

Ethmoid: Previous bilateral ethmoidectomy, well pneumatized.

Maxillary: Previous bilateral maxillary antrostomy, well
pneumatized.

Sphenoid: Normally aerated. Patent sphenoethmoidal recesses.

Right ostiomeatal unit: Widely patent status post maxillary
antrostomy (coronal image 35).

Left ostiomeatal unit: Widely patent status post maxillary
antrostomy (same image).

Nasal passages: Nasal septum is intact and fairly midline.
Postoperative changes to the bilateral turbinates. Symmetric but
mild nasal cavity mucosal thickening with trace retained secretions
on the left (coronal image 36). However, the nasal cavity remains
well pneumatized, olfactory recesses are clear. There are trace
retained secretions in the nasopharynx.

Anatomy:

Anterior ethmoidal artery position suspected on coronal image 37
with no pneumatization superior to the notches.

Keros type 2 olfactory fossa.

Sellar sphenoid pneumatization pattern.

No clinoid process pneumatization.

Other: Negative visible noncontrast brain parenchyma. Visualized
orbits and scalp soft tissues are within normal limits. Negative
visible noncontrast deep soft tissue spaces of the face.

No acute maxillary dental finding. No acute osseous abnormality
identified. Bilateral tympanic cavities and mastoids are clear.
IMPRESSION: 1. Previous bilateral maxillary antrostomy and ethmoidectomies, with
well pneumatized paranasal sinuses.
2. Symmetric but mild nasal cavity mucosal thickening with trace
retained secretions consider Rhinitis.

## 2022-09-29 ENCOUNTER — Ambulatory Visit (HOSPITAL_COMMUNITY)
Admission: EM | Admit: 2022-09-29 | Discharge: 2022-09-29 | Disposition: A | Discharge status: Home / Self Care | Payer: 59 | Attending: Family | Admitting: Family

## 2022-09-29 DIAGNOSIS — F411 Generalized anxiety disorder: Secondary | ICD-10-CM | POA: Diagnosis not present

## 2022-09-29 DIAGNOSIS — Z8659 Personal history of other mental and behavioral disorders: Secondary | ICD-10-CM | POA: Diagnosis not present

## 2022-09-29 MED ORDER — HYDROXYZINE HCL 25 MG PO TABS: 25.0000 mg | ORAL_TABLET | Freq: Three times a day (TID) | ORAL | 0 refills | Status: AC | PRN

## 2022-09-29 NOTE — Progress Notes (Signed)
   09/29/22 1635  [facility] Triage Screening (Walk-ins at [facility] only)  How Did You Hear About [place]? Self  What Is the Reason for Your Visit/Call Today? Pt arrived to [facility] unaccompanied by anyone. Pt states that she has been increased anxiety and panic attacks which have caused her chest pain and no sleep. Pt states that she doesn't have a therapist or psychiatrist at this present time. Pt states she does take medication for her anxiety with is prescribed by [facility]. Pt denies SI, HI, and AVH at this current time. Pt also denies the use of alcohol and/or drugs at this moment.  How Long Has This Been Causing You Problems? <Week  Have You Recently Had Any Thoughts About Hurting Yourself? No  Are You Planning to Commit Suicide/Harm Yourself At This time? No  Have you Recently Had Thoughts About Hurting Someone Karolee Ohs? No  Are You Planning To Harm Someone At This Time? No  Are you currently experiencing any auditory, visual or other hallucinations? No  Have You Used Any Alcohol or Drugs in the Past 24 Hours? No  Do you have any current medical co-morbidities that require immediate attention? No  Clinician description of patient physical appearance/behavior: dressed in workout attire, tearful, cooperative  What Do You Feel Would Help You the Most Today? Medication(s);Social Support  If access to [facility] was not available, would you have sought care in the Emergency Department? Yes  Determination of Need Routine (7 days)  Options For Referral Medication Management;Outpatient Therapy

## 2022-09-29 NOTE — Discharge Instructions (Signed)
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
